# Patient Record
Sex: Male | Born: 1970
Health system: Southern US, Community
[De-identification: ages and names within clinical notes are randomized; demographics above are authoritative.]

## PROBLEM LIST (undated history)

## (undated) DIAGNOSIS — B999 Unspecified infectious disease: Secondary | ICD-10-CM

## (undated) DIAGNOSIS — T4145XA Adverse effect of unspecified anesthetic, initial encounter: Secondary | ICD-10-CM

## (undated) DIAGNOSIS — J189 Pneumonia, unspecified organism: Secondary | ICD-10-CM

## (undated) DIAGNOSIS — Z8719 Personal history of other diseases of the digestive system: Secondary | ICD-10-CM

## (undated) DIAGNOSIS — B957 Other staphylococcus as the cause of diseases classified elsewhere: Secondary | ICD-10-CM

## (undated) DIAGNOSIS — K219 Gastro-esophageal reflux disease without esophagitis: Secondary | ICD-10-CM

## (undated) DIAGNOSIS — L02414 Cutaneous abscess of left upper limb: Secondary | ICD-10-CM

## (undated) DIAGNOSIS — Z973 Presence of spectacles and contact lenses: Secondary | ICD-10-CM

## (undated) DIAGNOSIS — R55 Syncope and collapse: Secondary | ICD-10-CM

## (undated) DIAGNOSIS — T8859XA Other complications of anesthesia, initial encounter: Secondary | ICD-10-CM

## (undated) DIAGNOSIS — G4730 Sleep apnea, unspecified: Secondary | ICD-10-CM

## (undated) DIAGNOSIS — S29011A Strain of muscle and tendon of front wall of thorax, initial encounter: Secondary | ICD-10-CM

## (undated) DIAGNOSIS — Z8711 Personal history of peptic ulcer disease: Secondary | ICD-10-CM

## (undated) HISTORY — PX: WISDOM TOOTH EXTRACTION: SHX21

## (undated) HISTORY — PX: COLONOSCOPY: SHX174

## (undated) HISTORY — DX: Other staphylococcus as the cause of diseases classified elsewhere: B95.7

## (undated) HISTORY — PX: TONSILLECTOMY: SUR1361

---

## 2010-11-11 ENCOUNTER — Other Ambulatory Visit: Payer: Self-pay | Admitting: Gastroenterology

## 2010-11-11 DIAGNOSIS — K6289 Other specified diseases of anus and rectum: Secondary | ICD-10-CM

## 2010-11-18 ENCOUNTER — Ambulatory Visit
Admission: RE | Admit: 2010-11-18 | Discharge: 2010-11-18 | Disposition: A | Discharge status: Home / Self Care | Payer: BC Managed Care – PPO | Source: Ambulatory Visit | Attending: Gastroenterology | Admitting: Gastroenterology

## 2010-11-18 DIAGNOSIS — K6289 Other specified diseases of anus and rectum: Secondary | ICD-10-CM

## 2010-11-18 MED ORDER — IOHEXOL 300 MG/ML  SOLN
125.0000 mL | Freq: Once | INTRAMUSCULAR | Status: AC | PRN
  Administered 2010-11-18: 125 mL via INTRAVENOUS

## 2013-11-28 ENCOUNTER — Encounter (INDEPENDENT_AMBULATORY_CARE_PROVIDER_SITE_OTHER): Payer: Self-pay | Admitting: General Surgery

## 2013-11-28 ENCOUNTER — Ambulatory Visit (INDEPENDENT_AMBULATORY_CARE_PROVIDER_SITE_OTHER): Payer: BC Managed Care – PPO | Admitting: General Surgery

## 2013-11-28 VITALS — BP 146/84 | HR 74 | Temp 97.7°F | Ht 73.0 in | Wt 200.0 lb

## 2013-11-28 DIAGNOSIS — K429 Umbilical hernia without obstruction or gangrene: Secondary | ICD-10-CM

## 2013-11-28 NOTE — Patient Instructions (Signed)
Umbilical Herniorrhaphy Herniorrhaphy is surgery to repair a hernia. A hernia is the protrusion of a part of an organ through an abdominal opening. An umbilical hernia means that your hernia is in the area around your navel. If the hernia is not repaired, the gap could get bigger. Your intestines or other tissues, such as fat, could get trapped in the gap. This can lead to other health problems, such as blocked intestines. If the hernia is fixed before problems set in, you may be allowed to go home the same day as the surgery (outpatient). LET YOUR CAREGIVER KNOW ABOUT:  Allergies to food or medicine.  Medicines taken, including vitamins, herbs, eyedrops, over-the-counter medicines, and creams.  Use of steroids (by mouth or creams).  Previous problems with anesthetics or numbing medicines.  History of bleeding problems or blood clots.  Previous surgery.  Other health problems, including diabetes and kidney problems.  Possibility of pregnancy, if this applies. RISKS AND COMPLICATIONS  Pain.  Excessive bleeding.  Hematoma. This is a pocket of blood that collects under the surgery site.  Infection at the surgery site.  Numbness at the surgery site.  Swelling and bruising.  Blood clots.  Intestinal damage (rare).  Scarring.  Skin damage.  Development of another hernia. This may require another surgery. BEFORE THE PROCEDURE  Ask your caregiver about changing or stopping your regular medicines. You may need to stop taking aspirin, nonsteroidal anti-inflammatory drugs (NSAIDs), vitamin E, and blood thinners as early as 2 weeks before the procedure.  Do not eat or drink for 8 hours before the procedure, or as directed by your caregiver.  You might be asked to shower or wash with an antibacterial soap before the procedure.  Wear comfortable clothes that will be easy to put on after the procedure. PROCEDURE You will be given an intravenous (IV) tube. A needle will be  inserted in your arm. Medicine will flow directly into your body through this needle. You might be given medicine to help you relax (sedative). You will be given medicine that numbs the area (local anesthetic) or medicine that makes you sleep (general anesthetic). If you have open surgery:  The surgeon will make a cut (incision) in your abdomen.  The gap in the muscle wall will be repaired. The surgeon may sew the edges together over the gap or use a mesh material to strengthen the area. When mesh is used, the body grows new, strong tissue into and around it. This new tissue closes the gap.  A drain might be put in to remove excess fluid from the body after surgery.  The surgeon will close the incision with stitches, glue, or staples. If you have laparoscopic surgery:  The surgeon will make several small incisions in your abdomen.  A thin, lighted tube (laparoscope) will be inserted into the abdomen through an incision. A camera is attached to the laparoscope that allows the surgeon to see inside the abdomen.  Tools will be inserted through the other incisions to repair the hernia. Usually, mesh is used to cover the gap.  The surgeon will close the incisions with stitches. AFTER THE PROCEDURE  You will be taken to a recovery area. A nurse will watch and check your progress.  When you are awake, feeling well, and taking fluids well, you may be allowed to go home. In some cases, you may need to stay overnight in the hospital.  Arrange for someone to drive you home. Document Released: 09/08/2008 Document Revised: 12/12/2011 Document   Reviewed: 09/13/2011 Exit[employer] Patient Information 2014 [employer].  

## 2013-11-28 NOTE — Progress Notes (Signed)
Subjective:   hernia at umbilicus  Patient ID: Jerry Carey, male   DOB: Jun 27, 1970, 43 y.o.   MRN: [id number]  HPI Patient is a 43 year old male referred by Dr. Juleen China for an umbilical hernia. The patient first noted a bulge at his umbilicus several months ago. He has not had any pain or associated GI symptoms. 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 He is very concerned about cosmesis of the hernia and the repair in terms of his high-level compititions. He has not had any previous hernia repairs or abdominal surgery.  History reviewed. No pertinent past medical history. Past Surgical History  Procedure Laterality Date  . Wisdom tooth extraction     No current outpatient prescriptions on file.   No current facility-administered medications for this visit.   No Known Allergies History  Substance Use Topics  . Smoking status: Former Smoker    Types: Cigarettes    Quit date: 11/28/2008  . Smokeless tobacco: Not on file  . Alcohol Use: Yes     Review of Systems Comprehensive review of systems was negative    Objective:   Physical Exam General: Alert, muscular Caucasian male, in no distress Skin: Warm and dry without rash or infection. HEENT: Sclera nonicteric. Pupils equal round and reactive.  Lungs: Breath sounds clear and equal without increased work of breathing Cardiovascular: Regular rate and rhythm without murmur. No JVD or edema. Peripheral pulses intact. Abdomen: Nondistended. Soft and nontender. No masses palpable. No organomegaly. There is a discrete hernia at the umbilicus which feels to be coming through a fairly small defect. Tender on palpationNo other abdominal wall or inguinal hernias. No diastases. Extremities: No edema or joint swelling or deformity. No chronic venous stasis changes. Neurologic: Alert and fully oriented. Gait normal.    Assessment:     Umbilical hernia. He has a significant hernia  and I think is at some risk for incarceration particularly with weightlifting. I recommended repair under general anesthesia as an outpatient using a ventral patch to minimize scarring. We had a long discussion regarding his desire to return to weightlifting as soon as possible and I told him that I did not want him putting excessive strain on his abdominal musculature for 4 weeks after surgery. We discussed the indications for the surgery and nature of the surgery and recovery as well as risks of anesthetic complications, bleeding, infection and risk of recurrence. We discussed the indications for use of mesh and the type of mesh used. He was given a length for the [facility] Video for umbilical hernia repair. All his questions were answered.     Plan:     Repair of umbilical hernia with mesh under general anesthesia as an outpatient

## 2015-01-24 ENCOUNTER — Encounter (HOSPITAL_COMMUNITY): Payer: Self-pay

## 2015-01-24 ENCOUNTER — Emergency Department (HOSPITAL_COMMUNITY): Payer: Self-pay

## 2015-01-24 ENCOUNTER — Emergency Department (HOSPITAL_COMMUNITY)
Admission: EM | Admit: 2015-01-24 | Discharge: 2015-01-25 | Disposition: A | Discharge status: Home / Self Care | Payer: Self-pay | Attending: Emergency Medicine | Admitting: Emergency Medicine

## 2015-01-24 DIAGNOSIS — W231XXA Caught, crushed, jammed, or pinched between stationary objects, initial encounter: Secondary | ICD-10-CM | POA: Insufficient documentation

## 2015-01-24 DIAGNOSIS — S62308A Unspecified fracture of other metacarpal bone, initial encounter for closed fracture: Secondary | ICD-10-CM

## 2015-01-24 DIAGNOSIS — Y9389 Activity, other specified: Secondary | ICD-10-CM | POA: Insufficient documentation

## 2015-01-24 DIAGNOSIS — Z87891 Personal history of nicotine dependence: Secondary | ICD-10-CM | POA: Insufficient documentation

## 2015-01-24 DIAGNOSIS — Y929 Unspecified place or not applicable: Secondary | ICD-10-CM | POA: Insufficient documentation

## 2015-01-24 DIAGNOSIS — Y998 Other external cause status: Secondary | ICD-10-CM | POA: Insufficient documentation

## 2015-01-24 DIAGNOSIS — S62394A Other fracture of fourth metacarpal bone, right hand, initial encounter for closed fracture: Secondary | ICD-10-CM | POA: Insufficient documentation

## 2015-01-24 MED ORDER — HYDROCODONE-ACETAMINOPHEN 5-325 MG PO TABS
1.0000 | ORAL_TABLET | Freq: Once | ORAL | Status: AC
  Administered 2015-01-25: 1 via ORAL
  Filled 2015-01-24: qty 1

## 2015-01-24 NOTE — ED Provider Notes (Signed)
CSN: [id number]     Arrival date & time 01/24/15  2252 History  This chart was scribed for Everlene Farrier, PA-C, working with Mirian Mo, MD by Chestine Spore, ED Scribe. The patient was seen in room TR05C/TR05C at 11:53 PM.     Chief Complaint  Patient presents with  . Hand Injury     The history is provided by the patient. No language interpreter was used.    HPI Comments: Jerry Carey is a 44 y.o. male who presents to the Emergency Department complaining of right hand injury onset yesterday at 5:30 PM. Pt notes that he was using a gas powered agar and the machine spun and caught his right hand. Pt reports that his hand swelling has not gotten better since the incident occurred. Pt notes that he is right hand dominant. Pt reports that his pain is 5/10 while sitting and 10/10 with movement. Pt notes that he had a boxers fracture to the same hand 20 years ago He states that he is having associated symptoms of right hand swelling. He states that he has tried 800 mg of advil at 8 AM and 4 PM today with some relief for his symptoms. He denies numbness, tingling, weakness, color change, wound, and any other symptoms. He denies right wrist pain.   History reviewed. No pertinent past medical history. Past Surgical History  Procedure Laterality Date  . Wisdom tooth extraction     No family history on file. History  Substance Use Topics  . Smoking status: Former Smoker    Types: Cigarettes    Quit date: 11/28/2008  . Smokeless tobacco: Not on file  . Alcohol Use: Yes    Review of Systems  Constitutional: Negative for fever and chills.  Musculoskeletal: Positive for joint swelling and arthralgias.  Skin: Negative for color change, rash and wound.  Neurological: Negative for weakness and numbness.       No tingling      Allergies  Review of patient's allergies indicates no known allergies.  Home Medications   Prior to Admission medications   Medication Sig Start Date End  Date Taking? Authorizing Provider  HYDROcodone-acetaminophen (NORCO/VICODIN) 5-325 MG per tablet Take 1 tablet by mouth every 4 (four) hours as needed. 01/25/15   Everlene Farrier, PA-C  naproxen (NAPROSYN) 250 MG tablet Take 1 tablet (250 mg total) by mouth 2 (two) times daily with a meal. 01/25/15   Everlene Farrier, PA-C   BP 134/64 mmHg  Pulse 92  Temp(Src) 98.4 F (36.9 C) (Oral)  Resp 16  Ht  (1.854 m)  Wt 208 lb 4 oz (94.462 kg)  BMI 27.48 kg/m2  SpO2 98% Physical Exam  Constitutional: He appears well-developed and well-nourished. No distress.  HENT:  Head: Normocephalic and atraumatic.  Eyes: Right eye exhibits no discharge. Left eye exhibits no discharge.  Cardiovascular:  Bilateral radial pulses intact  Pulmonary/Chest: Effort normal. No respiratory distress.  Musculoskeletal: He exhibits edema.       Right hand: He exhibits swelling. He exhibits normal capillary refill. Normal sensation noted. Normal strength noted.  Right hand: Edema over dorsal aspect of hand. Brisk cap refill. Sensation intact. Able to make a fist. Nl knuckle alignment. Compartments are soft.  Neurological: He is alert. Coordination normal.  Skin: No rash noted. He is not diaphoretic.  Psychiatric: He has a normal mood and affect. His behavior is normal.  Nursing note and vitals reviewed.   ED Course  Procedures (including critical care time) DIAGNOSTIC STUDIES:  Oxygen Saturation is 98% on RA, nl by my interpretation.    COORDINATION OF CARE: 11:57 PM-Discussed treatment plan which includes referral to hand surgeon with pt at bedside and pt agreed to plan.   Labs Review Labs Reviewed - No data to display  Imaging Review Dg Wrist Complete Right  01/24/2015   CLINICAL DATA:  Right hand and wrist pain after injury 2 days prior. Swelling.  EXAM: RIGHT WRIST - COMPLETE 3+ VIEW  COMPARISON:  None.  FINDINGS: Fracture of the fourth metacarpal. No addition fracture or dislocation of the wrist. The wrist  alignment and joint spaces are maintained. The scaphoid is intact with cystic changes distally.  IMPRESSION: Fourth metacarpal fracture. No additional fracture or dislocation of the wrist.   Electronically Signed   By: Rubye Oaks M.D.   On: 01/24/2015 23:45   Dg Hand Complete Right  01/24/2015   CLINICAL DATA:  Right hand and wrist pain after injury 2 days prior. Swelling.  EXAM: RIGHT HAND - COMPLETE 3+ VIEW  COMPARISON:  None.  FINDINGS: Oblique mildly displaced fourth metacarpal fracture. Proximal extension may be to the carpometacarpal joint. No additional fracture of the hand. There is dorsal soft tissue edema. Cystic change noted in the distal scaphoid.  IMPRESSION: Oblique fourth metacarpal fracture, with possible extension to the carpometacarpal joint.   Electronically Signed   By: Rubye Oaks M.D.   On: 01/24/2015 23:47     EKG Interpretation None      Filed Vitals:   01/24/15 2258  BP: 134/64  Pulse: 92  Temp: 98.4 F (36.9 C)  TempSrc: Oral  Resp: 16  Height: 6\' 1"  (1.854 m)  Weight: 208 lb 4 oz (94.462 kg)  SpO2: 98%     MDM   Meds given in ED:  Medications  HYDROcodone-acetaminophen (NORCO/VICODIN) 5-325 MG per tablet 1 tablet (not administered)    New Prescriptions   HYDROCODONE-ACETAMINOPHEN (NORCO/VICODIN) 5-325 MG PER TABLET    Take 1 tablet by mouth every 4 (four) hours as needed.   NAPROXEN (NAPROSYN) 250 MG TABLET    Take 1 tablet (250 mg total) by mouth 2 (two) times daily with a meal.    Final diagnoses:  Closed fracture of 4th metacarpal, initial encounter   This is a 44 year old male who is right-hand dominant and otherwise healthy who presents to the emergency department complaining of right hand pain after he was hit with the handle of an auger 2 days ago. Patient denies any numbness or tingling or weakness. Patient reports swelling. On exam the patient is afebrile nontoxic appearing. Patient has a moderate amount of edema to the dorsal aspect  of his right hand. His hand compartment soft. He is able to wiggle his fingers without difficulty. He is able to make a fist with normal knuckle alignment. X-ray indicates an oblique fourth metacarpal fracture, with possible extension into the carpometacarpal joint. After discussing with my attending Dr. Littie Deeds, plan is for the patient to receive a splint and to follow-up with hand surgeon Dr. Mina Marble. Advised patient to call hand surgeon Dr. Mina Marble make an appointment for follow-up for tomorrow. We'll discharge the patient with Norco and naproxen. I advised the patient to follow-up with their primary care provider and hand surgery this week. I advised the patient to return to the emergency department with new or worsening symptoms or new concerns. The patient verbalized understanding and agreement with plan.    This patient was discussed with Dr. Littie Deeds who agrees with assessment  and plan.  I personally performed the services described in this documentation, which was scribed in my presence. The recorded information has been reviewed and is accurate.      Everlene Farrier, PA-C 01/25/15 1610  Mirian Mo, MD 01/28/15 [phone]

## 2015-01-24 NOTE — ED Notes (Signed)
Pt reports he was using a gas powered auger, the machine spun and hit his right hand; two days ago. Pt right hand appears swollen, +radial pulse, able to wiggle fingers. He reports his hand just hasn't gotten any better since 2 days ago when the accident happened.

## 2015-01-25 MED ORDER — HYDROCODONE-ACETAMINOPHEN 5-325 MG PO TABS: 1.0000 | ORAL_TABLET | ORAL | Status: DC | PRN

## 2015-01-25 MED ORDER — NAPROXEN 250 MG PO TABS: 250.0000 mg | ORAL_TABLET | Freq: Two times a day (BID) | ORAL | Status: DC

## 2015-01-25 NOTE — Discharge Instructions (Signed)
Hand Fracture, Metacarpals °Fractures of metacarpals are breaks in the bones of the hand. They extend from the knuckles to the wrist. These bones can undergo many types of fractures. There are different ways of treating these fractures, all of which may be correct. °TREATMENT  °Hand fractures can be treated with:  °· Non-reduction - The fracture is casted without changing the positions of the fracture (bone pieces) involved. This fracture is usually left in a cast for 4 to 6 weeks or as your caregiver thinks necessary. °· Closed reduction - The bones are moved back into position without surgery and then casted. °· ORIF (open reduction and internal fixation) - The fracture site is opened and the bone pieces are fixed into place with some type of hardware, such as screws, etc. They are then casted. °Your caregiver will discuss the type of fracture you have and the treatment that should be best for that problem. If surgery is chosen, let your caregivers know about the following.  °LET YOUR CAREGIVERS KNOW ABOUT: °· Allergies. °· Medications you are taking, including herbs, eye drops, over the counter medications, and creams. °· Use of steroids (by mouth or creams). °· Previous problems with anesthetics or novocaine. °· Possibility of pregnancy. °· History of blood clots (thrombophlebitis). °· History of bleeding or blood problems. °· Previous surgeries. °· Other health problems. °AFTER THE PROCEDURE °After surgery, you will be taken to the recovery area where a nurse will watch and check your progress. Once you are awake, stable, and taking fluids well, barring other problems, you'll be allowed to go home. Once home, an ice pack applied to your operative site may help with pain and keep the swelling down. °HOME CARE INSTRUCTIONS  °· Follow your caregiver's instructions as to activities, exercises, physical therapy, and driving a car. °· Daily exercise is helpful for keeping range of motion and strength. Exercise as  instructed. °· To lessen swelling, keep the injured hand elevated above the level of your heart as much as possible. °· Apply ice to the injury for 15-20 minutes each hour while awake for the first 2 days. Put the ice in a plastic bag and place a thin towel between the bag of ice and your cast. °· Move the fingers of your casted hand several times a day. °· If a plaster or fiberglass cast was applied: °· Do not try to scratch the skin under the cast using a sharp or pointed object. °· Check the skin around the cast every day. You may put lotion on red or sore areas. °· Keep your cast dry. Your cast can be protected during bathing with a plastic bag. Do not put your cast into the water. °· If a plaster splint was applied: °· Wear your splint for as long as directed by your caregiver or until seen again. °· Do not get your splint wet. Protect it during bathing with a plastic bag. °· You may loosen the elastic bandage around the splint if your fingers start to get numb, tingle, get cold or turn blue. °· Do not put pressure on your cast or splint; this may cause it to break. Especially, do not lean plaster casts on hard surfaces for 24 hours after application. °· Take medications as directed by your caregiver. °· Only take over-the-counter or prescription medicines for pain, discomfort, or fever as directed by your caregiver. °· Follow-up as provided by your caregiver. This is very important in order to avoid permanent injury or disability and chronic   pain. °SEEK MEDICAL CARE IF:  °· Increased bleeding (more than a small spot) from beneath your cast or splint if there is beneath the cast as with an open reduction. °· Redness, swelling, or increasing pain in the wound or from beneath your cast or splint. °· Pus coming from wound or from beneath your cast or splint. °· An unexplained oral temperature above 102° F (38.9° C) develops, or as your caregiver suggests. °· A foul smell coming from the wound or dressing or from  beneath your cast or splint. °· You have a problem moving any of your fingers. °SEEK IMMEDIATE MEDICAL CARE IF:  °· You develop a rash °· You have difficulty breathing °· You have any allergy problems °If you do not have a window in your cast for observing the wound, a discharge or minor bleeding may show up as a stain on the outside of your cast. Report these findings to your caregiver. °MAKE SURE YOU:  °· Understand these instructions. °· Will watch your condition. °· Will get help right away if you are not doing well or get worse. °Document Released: 06/12/2005 Document Revised: 09/04/2011 Document Reviewed: 01/30/2008 °[employer]® Patient Information ©2015 [employer]. This information is not intended to replace advice given to you by your health care provider. Make sure you discuss any questions you have with your health care provider. ° °Cast or Splint Care °Casts and splints support injured limbs and keep bones from moving while they heal. It is important to care for your cast or splint at home.   °HOME CARE INSTRUCTIONS °· Keep the cast or splint uncovered during the drying period. It can take 24 to 48 hours to dry if it is made of plaster. A fiberglass cast will dry in less than 1 hour. °· Do not rest the cast on anything harder than a pillow for the first 24 hours. °· Do not put weight on your injured limb or apply pressure to the cast until your health care provider gives you permission. °· Keep the cast or splint dry. Wet casts or splints can lose their shape and may not support the limb as well. A wet cast that has lost its shape can also create harmful pressure on your skin when it dries. Also, wet skin can become infected. °¨ Cover the cast or splint with a plastic bag when bathing or when out in the rain or snow. If the cast is on the trunk of the body, take sponge baths until the cast is removed. °¨ If your cast does become wet, dry it with a towel or a blow dryer on the cool setting only. °· Keep  your cast or splint clean. Soiled casts may be wiped with a moistened cloth. °· Do not place any hard or soft foreign objects under your cast or splint, such as cotton, toilet paper, lotion, or powder. °· Do not try to scratch the skin under the cast with any object. The object could get stuck inside the cast. Also, scratching could lead to an infection. If itching is a problem, use a blow dryer on a cool setting to relieve discomfort. °· Do not trim or cut your cast or remove padding from inside of it. °· Exercise all joints next to the injury that are not immobilized by the cast or splint. For example, if you have a long leg cast, exercise the hip joint and toes. If you have an arm cast or splint, exercise the shoulder, elbow, thumb, and fingers. °·   Elevate your injured arm or leg on 1 or 2 pillows for the first 1 to 3 days to decrease swelling and pain. It is best if you can comfortably elevate your cast so it is higher than your heart. °SEEK MEDICAL CARE IF:  °· Your cast or splint cracks. °· Your cast or splint is too tight or too loose. °· You have unbearable itching inside the cast. °· Your cast becomes wet or develops a soft spot or area. °· You have a bad smell coming from inside your cast. °· You get an object stuck under your cast. °· Your skin around the cast becomes red or raw. °· You have new pain or worsening pain after the cast has been applied. °SEEK IMMEDIATE MEDICAL CARE IF:  °· You have fluid leaking through the cast. °· You are unable to move your fingers or toes. °· You have discolored (blue or white), cool, painful, or very swollen fingers or toes beyond the cast. °· You have tingling or numbness around the injured area. °· You have severe pain or pressure under the cast. °· You have any difficulty with your breathing or have shortness of breath. °· You have chest pain. °Document Released: 06/09/2000 Document Revised: 04/02/2013 Document Reviewed: 12/19/2012 °[employer]® Patient Information  ©2015 [employer]. This information is not intended to replace advice given to you by your health care provider. Make sure you discuss any questions you have with your health care provider. ° °

## 2015-01-25 NOTE — Progress Notes (Addendum)
Orthopedic Tech Progress Note Patient Details:  Jerry Carey [birth year] [id number] Dorsal and ventral volar splint applied Ortho Devices Type of Ortho Device: Arm sling, Volar splint Ortho Device/Splint Location: RUe Ortho Device/Splint Interventions: Application   [name] 01/25/2015, 12:21 AM

## 2015-05-24 ENCOUNTER — Encounter (HOSPITAL_COMMUNITY): Payer: Self-pay | Admitting: *Deleted

## 2015-05-24 ENCOUNTER — Emergency Department (HOSPITAL_COMMUNITY)
Admission: EM | Admit: 2015-05-24 | Discharge: 2015-05-25 | Disposition: A | Discharge status: Home / Self Care | Payer: Self-pay | Attending: Emergency Medicine | Admitting: Emergency Medicine

## 2015-05-24 DIAGNOSIS — S46912A Strain of unspecified muscle, fascia and tendon at shoulder and upper arm level, left arm, initial encounter: Secondary | ICD-10-CM | POA: Insufficient documentation

## 2015-05-24 DIAGNOSIS — Z87891 Personal history of nicotine dependence: Secondary | ICD-10-CM | POA: Insufficient documentation

## 2015-05-24 DIAGNOSIS — Y9389 Activity, other specified: Secondary | ICD-10-CM | POA: Insufficient documentation

## 2015-05-24 DIAGNOSIS — Y9289 Other specified places as the place of occurrence of the external cause: Secondary | ICD-10-CM | POA: Insufficient documentation

## 2015-05-24 DIAGNOSIS — X509XXA Other and unspecified overexertion or strenuous movements or postures, initial encounter: Secondary | ICD-10-CM | POA: Insufficient documentation

## 2015-05-24 DIAGNOSIS — Z791 Long term (current) use of non-steroidal anti-inflammatories (NSAID): Secondary | ICD-10-CM | POA: Insufficient documentation

## 2015-05-24 DIAGNOSIS — Y998 Other external cause status: Secondary | ICD-10-CM | POA: Insufficient documentation

## 2015-05-24 MED ORDER — FENTANYL CITRATE (PF) 100 MCG/2ML IJ SOLN
50.0000 ug | Freq: Once | INTRAMUSCULAR | Status: AC
  Administered 2015-05-24: 50 ug via NASAL
  Filled 2015-05-24: qty 2

## 2015-05-24 MED ORDER — FENTANYL CITRATE (PF) 100 MCG/2ML IJ SOLN
50.0000 ug | Freq: Once | INTRAMUSCULAR | Status: AC
  Administered 2015-05-24: 50 ug via NASAL

## 2015-05-24 MED ORDER — FENTANYL CITRATE (PF) 100 MCG/2ML IJ SOLN
INTRAMUSCULAR | Status: AC
  Filled 2015-05-24: qty 2

## 2015-05-24 NOTE — ED Notes (Signed)
Pt reports working out, believes he tore his left pectoral muscle and having severe pain to left chest/shoulder area. Appears in severe pain at triage.

## 2015-05-25 ENCOUNTER — Emergency Department (HOSPITAL_COMMUNITY): Payer: Self-pay

## 2015-05-25 MED ORDER — CYCLOBENZAPRINE HCL 10 MG PO TABS
10.0000 mg | ORAL_TABLET | Freq: Once | ORAL | Status: AC
  Administered 2015-05-25: 10 mg via ORAL
  Filled 2015-05-25: qty 1

## 2015-05-25 MED ORDER — OXYCODONE-ACETAMINOPHEN 5-325 MG PO TABS: 1.0000 | ORAL_TABLET | ORAL | Status: DC | PRN

## 2015-05-25 MED ORDER — ORPHENADRINE CITRATE ER 100 MG PO TB12: 100.0000 mg | ORAL_TABLET | Freq: Two times a day (BID) | ORAL | Status: DC

## 2015-05-25 MED ORDER — OXYCODONE-ACETAMINOPHEN 5-325 MG PO TABS
1.0000 | ORAL_TABLET | Freq: Once | ORAL | Status: AC
  Administered 2015-05-25: 1 via ORAL
  Filled 2015-05-25: qty 1

## 2015-05-25 MED ORDER — FENTANYL CITRATE (PF) 100 MCG/2ML IJ SOLN
50.0000 ug | Freq: Once | INTRAMUSCULAR | Status: AC
  Administered 2015-05-25: 50 ug via NASAL
  Filled 2015-05-25: qty 2

## 2015-05-25 MED ORDER — IBUPROFEN 800 MG PO TABS
800.0000 mg | ORAL_TABLET | Freq: Once | ORAL | Status: AC
  Administered 2015-05-25: 800 mg via ORAL
  Filled 2015-05-25: qty 1

## 2015-05-25 MED ORDER — NAPROXEN 500 MG PO TABS: 500.0000 mg | ORAL_TABLET | Freq: Two times a day (BID) | ORAL | Status: DC

## 2015-05-25 NOTE — ED Provider Notes (Signed)
CSN: [id number]     Arrival date & time 05/24/15  1833 History  By signing my name below, I, Emmanuella Mensah, attest that this documentation has been prepared under the direction and in the presence of Dione Booze, MD. Electronically Signed: Angelene Giovanni, ED Scribe. 05/25/2015. 12:23 AM.    Chief Complaint  Patient presents with  . Pain   The history is provided by the patient. No language interpreter was used.   HPI Comments: Jerry Carey is a 44 y.o. male who presents to the Emergency Department complaining of gradually worsening severe left shoulder pain s/p weight injury that occurred at the gym earlier today. 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 He reports that he is here to see if he has a partial or complete tear. He denies currently having an Orthopedics.    History reviewed. No pertinent past medical history. Past Surgical History  Procedure Laterality Date  . Wisdom tooth extraction     History reviewed. No pertinent family history. Social History  Substance Use Topics  . Smoking status: Former Smoker    Types: Cigarettes    Quit date: 11/28/2008  . Smokeless tobacco: None  . Alcohol Use: Yes    Review of Systems  Constitutional: Negative for fever and chills.  Musculoskeletal: Positive for myalgias and arthralgias.  Neurological: Negative for numbness.  All other systems reviewed and are negative.     Allergies  Review of patient's allergies indicates no known allergies.  Home Medications   Prior to Admission medications   Medication Sig Start Date End Date Taking? Authorizing Provider  HYDROcodone-acetaminophen (NORCO/VICODIN) 5-325 MG per tablet Take 1 tablet by mouth every 4 (four) hours as needed. 01/25/15   Everlene Farrier, PA-C  naproxen (NAPROSYN) 250 MG tablet Take 1 tablet  (250 mg total) by mouth 2 (two) times daily with a meal. 01/25/15   Everlene Farrier, PA-C   BP 157/90 mmHg  Pulse 76  Temp(Src) 98.5 F (36.9 C) (Oral)  Resp 16  SpO2 96% Physical Exam  Constitutional: He is oriented to person, place, and time. He appears well-developed and well-nourished. No distress.  HENT:  Head: Normocephalic and atraumatic.  Eyes: EOM are normal. Pupils are equal, round, and reactive to light.  Neck: Normal range of motion. Neck supple. No JVD present.  Cardiovascular: Normal rate, regular rhythm and normal heart sounds.   No murmur heard. Pulmonary/Chest: Effort normal and breath sounds normal. No respiratory distress. He has no wheezes. He has no rales. He exhibits no tenderness.  Abdominal: Soft. Bowel sounds are normal. He exhibits no distension and no mass. There is no tenderness.  Musculoskeletal: Normal range of motion. He exhibits tenderness. He exhibits no edema.  Tenderness over the left shoulder anteriorly  Pain on passive external rotation, neurovascularly intact.   Lymphadenopathy:    He has no cervical adenopathy.  Neurological: He is alert and oriented to person, place, and time. No cranial nerve deficit. He exhibits normal muscle tone. Coordination normal.  Skin: Skin is warm and dry. No rash noted.  Psychiatric: He has a normal mood and affect. His behavior is normal. Judgment and thought content normal.  Nursing note and vitals reviewed.   ED Course  Procedures (including critical care time) DIAGNOSTIC STUDIES: Oxygen Saturation is 96% on RA, adequate by my interpretation.    COORDINATION OF  CARE: 12:20 AM- Pt advised of plan for treatment and pt agrees. Pt will receive X-ray and pain medication.   Imaging Review Dg Shoulder Left  05/25/2015  CLINICAL DATA:  Left chest pain after weight lifting tonight. EXAM: LEFT SHOULDER - 2+ VIEW COMPARISON:  None. FINDINGS: There is no evidence of fracture or dislocation. There is no evidence of  arthropathy or other focal bone abnormality. Soft tissues are unremarkable. IMPRESSION: Negative. Electronically Signed   By: Ellery Plunkaniel R Mitchell M.D.   On: 05/25/2015 01:37     Dione Boozeavid Ijanae Macapagal, MD has personally reviewed and evaluated these images as part of his medical decision-making.   MDM   Final diagnoses:  Strain of left shoulder, initial encounter    Strain of the left shoulder. He appears to partly or possibly completely torn his paralysis major muscle or tendon. He is given oxycodone have acetaminophen for pain and sent for x-rays which showed no bony injury. Old records are reviewed and he has no relevant past records. He had good relief of pain with above noted treatment. He is given a sling for comfort and is referred to orthopedics for follow-up. Prescriptions are given for naproxen, orphenadrine, and oxycodone-acetaminophen.  I personally performed the services described in this documentation, which was scribed in my presence. The recorded information has been reviewed and is accurate.       Dione Boozeavid Nanna Ertle, MD 05/25/15 [phone]

## 2015-05-25 NOTE — Discharge Instructions (Signed)
Wear sling as needed.   Muscle Strain A muscle strain is an injury that occurs when a muscle is stretched beyond its normal length. Usually a small number of muscle fibers are torn when this happens. Muscle strain is rated in degrees. First-degree strains have the least amount of muscle fiber tearing and pain. Second-degree and third-degree strains have increasingly more tearing and pain.  Usually, recovery from muscle strain takes 1-2 weeks. Complete healing takes 5-6 weeks.  CAUSES  Muscle strain happens when a sudden, violent force placed on a muscle stretches it too far. This may occur with lifting, sports, or a fall.  RISK FACTORS Muscle strain is especially common in athletes.  SIGNS AND SYMPTOMS At the site of the muscle strain, there may be:  Pain.  Bruising.  Swelling.  Difficulty using the muscle due to pain or lack of normal function. DIAGNOSIS  Your health care provider will perform a physical exam and ask about your medical history. TREATMENT  Often, the best treatment for a muscle strain is resting, icing, and applying cold compresses to the injured area.  HOME CARE INSTRUCTIONS   Use the PRICE method of treatment to promote muscle healing during the first 2-3 days after your injury. The PRICE method involves:  Protecting the muscle from being injured again.  Restricting your activity and resting the injured body part.  Icing your injury. To do this, put ice in a plastic bag. Place a towel between your skin and the bag. Then, apply the ice and leave it on from 15-20 minutes each hour. After the third day, switch to moist heat packs.  Apply compression to the injured area with a splint or elastic bandage. Be careful not to wrap it too tightly. This may interfere with blood circulation or increase swelling.  Elevate the injured body part above the level of your heart as often as you can.  Only take over-the-counter or prescription medicines for pain, discomfort, or  fever as directed by your health care provider.  Warming up prior to exercise helps to prevent future muscle strains. SEEK MEDICAL CARE IF:   You have increasing pain or swelling in the injured area.  You have numbness, tingling, or a significant loss of strength in the injured area. MAKE SURE YOU:   Understand these instructions.  Will watch your condition.  Will get help right away if you are not doing well or get worse.   This information is not intended to replace advice given to you by your health care provider. Make sure you discuss any questions you have with your health care provider.   Document Released: 06/12/2005 Document Revised: 04/02/2013 Document Reviewed: 01/09/2013 Elsevier Interactive Patient Education 2016 [employer]  Naproxen and naproxen sodium oral immediate-release tablets What is this medicine? NAPROXEN (na PROX en) is a non-steroidal anti-inflammatory drug (NSAID). It is used to reduce swelling and to treat pain. This medicine may be used for dental pain, headache, or painful monthly periods. It is also used for painful joint and muscular problems such as arthritis, tendinitis, bursitis, and gout. This medicine may be used for other purposes; ask your health care provider or pharmacist if you have questions. What should I tell my health care provider before I take this medicine? They need to know if you have any of these conditions: -asthma -cigarette smoker -drink more than 3 alcohol containing drinks a day -heart disease or circulation problems such as heart failure or leg edema (fluid retention) -high blood pressure -kidney  disease -liver disease -stomach bleeding or ulcers -an unusual or allergic reaction to naproxen, aspirin, other NSAIDs, other medicines, foods, dyes, or preservatives -pregnant or trying to get pregnant -breast-feeding How should I use this medicine? Take this medicine by mouth with a glass of water. Follow the directions on  the prescription label. Take it with food if your stomach gets upset. Try to not lie down for at least 10 minutes after you take it. Take your medicine at regular intervals. Do not take your medicine more often than directed. Long-term, continuous use may increase the risk of heart attack or stroke. A special MedGuide will be given to you by the pharmacist with each prescription and refill. Be sure to read this information carefully each time. Talk to your pediatrician regarding the use of this medicine in children. Special care may be needed. Overdosage: If you think you have taken too much of this medicine contact a poison control center or emergency room at once. NOTE: This medicine is only for you. Do not share this medicine with others. What if I miss a dose? If you miss a dose, take it as soon as you can. If it is almost time for your next dose, take only that dose. Do not take double or extra doses. What may interact with this medicine? -alcohol -aspirin -cidofovir -diuretics -lithium -methotrexate -other drugs for inflammation like ketorolac or prednisone -pemetrexed -probenecid -warfarin This list may not describe all possible interactions. Give your health care provider a list of all the medicines, herbs, non-prescription drugs, or dietary supplements you use. Also tell them if you smoke, drink alcohol, or use illegal drugs. Some items may interact with your medicine. What should I watch for while using this medicine? Tell your doctor or health care professional if your pain does not get better. Talk to your doctor before taking another medicine for pain. Do not treat yourself. This medicine does not prevent heart attack or stroke. In fact, this medicine may increase the chance of a heart attack or stroke. The chance may increase with longer use of this medicine and in people who have heart disease. If you take aspirin to prevent heart attack or stroke, talk with your doctor or health  care professional. Do not take other medicines that contain aspirin, ibuprofen, or naproxen with this medicine. Side effects such as stomach upset, nausea, or ulcers may be more likely to occur. Many medicines available without a prescription should not be taken with this medicine. This medicine can cause ulcers and bleeding in the stomach and intestines at any time during treatment. Do not smoke cigarettes or drink alcohol. These increase irritation to your stomach and can make it more susceptible to damage from this medicine. Ulcers and bleeding can happen without warning symptoms and can cause death. You may get drowsy or dizzy. Do not drive, use machinery, or do anything that needs mental alertness until you know how this medicine affects you. Do not stand or sit up quickly, especially if you are an older patient. This reduces the risk of dizzy or fainting spells. This medicine can cause you to bleed more easily. Try to avoid damage to your teeth and gums when you brush or floss your teeth. What side effects may I notice from receiving this medicine? Side effects that you should report to your doctor or health care professional as soon as possible: -black or bloody stools, blood in the urine or vomit -blurred vision -chest pain -difficulty breathing or wheezing -nausea  or vomiting -severe stomach pain -skin rash, skin redness, blistering or peeling skin, hives, or itching -slurred speech or weakness on one side of the body -swelling of eyelids, throat, lips -unexplained weight gain or swelling -unusually weak or tired -yellowing of eyes or skin Side effects that usually do not require medical attention (report to your doctor or health care professional if they continue or are bothersome): -constipation -headache -heartburn This list may not describe all possible side effects. Call your doctor for medical advice about side effects. You may report side effects to FDA at  1-800-FDA-1088. Where should I keep my medicine? Keep out of the reach of children. Store at room temperature between 15 and 30 degrees C (59 and 86 degrees F). Keep container tightly closed. Throw away any unused medicine after the expiration date. NOTE: This sheet is a summary. It may not cover all possible information. If you have questions about this medicine, talk to your doctor, pharmacist, or health care provider.    2016, Elsevier/Gold Standard. (2009-06-14 20:10:16)  Orphenadrine tablets What is this medicine? ORPHENADRINE (or FEN a dreen) helps to relieve pain and stiffness in muscles and can treat muscle spasms. This medicine may be used for other purposes; ask your health care provider or pharmacist if you have questions. What should I tell my health care provider before I take this medicine? They need to know if you have any of these conditions: -glaucoma -heart disease -kidney disease -myasthenia gravis -peptic ulcer disease -prostate disease -stomach problems -an unusual or allergic reaction to orphenadrine, other medicines, foods, lactose, dyes, or preservatives -pregnant or trying to get pregnant -breast-feeding How should I use this medicine? Take this medicine by mouth with a full glass of water. Follow the directions on the prescription label. Take your medicine at regular intervals. Do not take your medicine more often than directed. Do not take more than you are told to take. Talk to your pediatrician regarding the use of this medicine in children. Special care may be needed. Patients over 29 years old may have a stronger reaction and need a smaller dose. Overdosage: If you think you have taken too much of this medicine contact a poison control center or emergency room at once. NOTE: This medicine is only for you. Do not share this medicine with others. What if I miss a dose? If you miss a dose, take it as soon as you can. If it is almost time for your next dose,  take only that dose. Do not take double or extra doses. What may interact with this medicine? -alcohol -antihistamines -barbiturates, like phenobarbital -benzodiazepines -cyclobenzaprine -medicines for pain -phenothiazines like chlorpromazine, mesoridazine, prochlorperazine, thioridazine This list may not describe all possible interactions. Give your health care provider a list of all the medicines, herbs, non-prescription drugs, or dietary supplements you use. Also tell them if you smoke, drink alcohol, or use illegal drugs. Some items may interact with your medicine. What should I watch for while using this medicine? Your mouth may get dry. Chewing sugarless gum or sucking hard candy, and drinking plenty of water may help. Contact your doctor if the problem does not go away or is severe. This medicine may cause dry eyes and blurred vision. If you wear contact lenses you may feel some discomfort. Lubricating drops may help. See your eye doctor if the problem does not go away or is severe. You may get drowsy or dizzy. Do not drive, use machinery, or do anything that needs mental alertness  until you know how this medicine affects you. Do not stand or sit up quickly, especially if you are an older patient. This reduces the risk of dizzy or fainting spells. Alcohol may interfere with the effect of this medicine. Avoid alcoholic drinks. What side effects may I notice from receiving this medicine? Side effects that you should report to your doctor or health care professional as soon as possible: -allergic reactions like skin rash, itching or hives, swelling of the face, lips, or tongue -changes in vision -difficulty breathing -fast heartbeat or palpitations -hallucinations -light headedness, fainting spells -vomiting Side effects that usually do not require medical attention (report to your doctor or health care professional if they continue or are  bothersome): -dizziness -drowsiness -headache -nausea This list may not describe all possible side effects. Call your doctor for medical advice about side effects. You may report side effects to FDA at 1-800-FDA-1088. Where should I keep my medicine? Keep out of the reach of children. This medicine may cause accidental overdose and death if it taken by other adults, children, or pets. Mix any unused medicine with a substance like cat litter or coffee grounds. Then throw the medicine away in a sealed container like a sealed bag or a coffee can with a lid. Do not use the medicine after the expiration date. Store at room temperature between 15 and 30 degrees C (59 and 86 degrees F). NOTE: This sheet is a summary. It may not cover all possible information. If you have questions about this medicine, talk to your doctor, pharmacist, or health care provider.    2016, Elsevier/Gold Standard. (2013-08-08 15:35:08)  Acetaminophen; Oxycodone tablets What is this medicine? ACETAMINOPHEN; OXYCODONE (a set a MEE noe fen; ox i KOE done) is a pain reliever. It is used to treat moderate to severe pain. This medicine may be used for other purposes; ask your health care provider or pharmacist if you have questions. What should I tell my health care provider before I take this medicine? They need to know if you have any of these conditions: -brain tumor -Crohn's disease, inflammatory bowel disease, or ulcerative colitis -drug abuse or addiction -head injury -heart or circulation problems -if you often drink alcohol -kidney disease or problems going to the bathroom -liver disease -lung disease, asthma, or breathing problems -an unusual or allergic reaction to acetaminophen, oxycodone, other opioid analgesics, other medicines, foods, dyes, or preservatives -pregnant or trying to get pregnant -breast-feeding How should I use this medicine? Take this medicine by mouth with a full glass of water. Follow the  directions on the prescription label. You can take it with or without food. If it upsets your stomach, take it with food. Take your medicine at regular intervals. Do not take it more often than directed. Talk to your pediatrician regarding the use of this medicine in children. Special care may be needed. Patients over 17 years old may have a stronger reaction and need a smaller dose. Overdosage: If you think you have taken too much of this medicine contact a poison control center or emergency room at once. NOTE: This medicine is only for you. Do not share this medicine with others. What if I miss a dose? If you miss a dose, take it as soon as you can. If it is almost time for your next dose, take only that dose. Do not take double or extra doses. What may interact with this medicine? -alcohol -antihistamines -barbiturates like amobarbital, butalbital, butabarbital, methohexital, pentobarbital, phenobarbital, thiopental, and  secobarbital -benztropine -drugs for bladder problems like solifenacin, trospium, oxybutynin, tolterodine, hyoscyamine, and methscopolamine -drugs for breathing problems like ipratropium and tiotropium -drugs for certain stomach or intestine problems like propantheline, homatropine methylbromide, glycopyrrolate, atropine, belladonna, and dicyclomine -general anesthetics like etomidate, ketamine, nitrous oxide, propofol, desflurane, enflurane, halothane, isoflurane, and sevoflurane -medicines for depression, anxiety, or psychotic disturbances -medicines for sleep -muscle relaxants -naltrexone -narcotic medicines (opiates) for pain -phenothiazines like perphenazine, thioridazine, chlorpromazine, mesoridazine, fluphenazine, prochlorperazine, promazine, and trifluoperazine -scopolamine -tramadol -trihexyphenidyl This list may not describe all possible interactions. Give your health care provider a list of all the medicines, herbs, non-prescription drugs, or dietary  supplements you use. Also tell them if you smoke, drink alcohol, or use illegal drugs. Some items may interact with your medicine. What should I watch for while using this medicine? Tell your doctor or health care professional if your pain does not go away, if it gets worse, or if you have new or a different type of pain. You may develop tolerance to the medicine. Tolerance means that you will need a higher dose of the medication for pain relief. Tolerance is normal and is expected if you take this medicine for a long time. Do not suddenly stop taking your medicine because you may develop a severe reaction. Your body becomes used to the medicine. This does NOT mean you are addicted. Addiction is a behavior related to getting and using a drug for a non-medical reason. If you have pain, you have a medical reason to take pain medicine. Your doctor will tell you how much medicine to take. If your doctor wants you to stop the medicine, the dose will be slowly lowered over time to avoid any side effects. You may get drowsy or dizzy. Do not drive, use machinery, or do anything that needs mental alertness until you know how this medicine affects you. Do not stand or sit up quickly, especially if you are an older patient. This reduces the risk of dizzy or fainting spells. Alcohol may interfere with the effect of this medicine. Avoid alcoholic drinks. There are different types of narcotic medicines (opiates) for pain. If you take more than one type at the same time, you may have more side effects. Give your health care provider a list of all medicines you use. Your doctor will tell you how much medicine to take. Do not take more medicine than directed. Call emergency for help if you have problems breathing. The medicine will cause constipation. Try to have a bowel movement at least every 2 to 3 days. If you do not have a bowel movement for 3 days, call your doctor or health care professional. Do not take Tylenol  (acetaminophen) or medicines that have acetaminophen with this medicine. Too much acetaminophen can be very dangerous. Many nonprescription medicines contain acetaminophen. Always read the labels carefully to avoid taking more acetaminophen. What side effects may I notice from receiving this medicine? Side effects that you should report to your doctor or health care professional as soon as possible: -allergic reactions like skin rash, itching or hives, swelling of the face, lips, or tongue -breathing difficulties, wheezing -confusion -light headedness or fainting spells -severe stomach pain -unusually weak or tired -yellowing of the skin or the whites of the eyes Side effects that usually do not require medical attention (report to your doctor or health care professional if they continue or are bothersome): -dizziness -drowsiness -nausea -vomiting This list may not describe all possible side effects. Call your doctor for  medical advice about side effects. You may report side effects to FDA at 1-800-FDA-1088. Where should I keep my medicine? Keep out of the reach of children. This medicine can be abused. Keep your medicine in a safe place to protect it from theft. Do not share this medicine with anyone. Selling or giving away this medicine is dangerous and against the law. This medicine may cause accidental overdose and death if it taken by other adults, children, or pets. Mix any unused medicine with a substance like cat litter or coffee grounds. Then throw the medicine away in a sealed container like a sealed bag or a coffee can with a lid. Do not use the medicine after the expiration date. Store at room temperature between 20 and 25 degrees C (68 and 77 degrees F). NOTE: This sheet is a summary. It may not cover all possible information. If you have questions about this medicine, talk to your doctor, pharmacist, or health care provider.    2016, Elsevier/Gold Standard. (2014-05-13  15:18:46)

## 2015-05-27 ENCOUNTER — Other Ambulatory Visit (HOSPITAL_COMMUNITY): Payer: Self-pay | Admitting: Orthopedic Surgery

## 2015-05-27 DIAGNOSIS — M25512 Pain in left shoulder: Secondary | ICD-10-CM

## 2015-05-29 ENCOUNTER — Ambulatory Visit (HOSPITAL_BASED_OUTPATIENT_CLINIC_OR_DEPARTMENT_OTHER)
Admission: RE | Admit: 2015-05-29 | Discharge: 2015-05-29 | Disposition: A | Discharge status: Home / Self Care | Payer: Self-pay | Source: Ambulatory Visit | Attending: Orthopedic Surgery | Admitting: Orthopedic Surgery

## 2015-05-29 DIAGNOSIS — M25512 Pain in left shoulder: Secondary | ICD-10-CM | POA: Insufficient documentation

## 2015-05-29 DIAGNOSIS — S46812A Strain of other muscles, fascia and tendons at shoulder and upper arm level, left arm, initial encounter: Secondary | ICD-10-CM | POA: Insufficient documentation

## 2015-05-29 DIAGNOSIS — X500XXA Overexertion from strenuous movement or load, initial encounter: Secondary | ICD-10-CM | POA: Insufficient documentation

## 2015-05-31 ENCOUNTER — Encounter (HOSPITAL_BASED_OUTPATIENT_CLINIC_OR_DEPARTMENT_OTHER): Payer: Self-pay | Admitting: *Deleted

## 2015-05-31 DIAGNOSIS — S29011A Strain of muscle and tendon of front wall of thorax, initial encounter: Secondary | ICD-10-CM | POA: Diagnosis present

## 2015-05-31 DIAGNOSIS — K219 Gastro-esophageal reflux disease without esophagitis: Secondary | ICD-10-CM | POA: Diagnosis present

## 2015-05-31 NOTE — H&P (Signed)
Jerry DegreeJoseph Carey is an 44 y.o. male.   Chief Complaint: left pectoralis major tear HPI: This patient was bench pressing when he felt a pop in his left arm.  He had immediate pain swelling and deformity.  MRI confirms pectoralis major tear.  Past Medical History  Diagnosis Date  . GERD (gastroesophageal reflux disease)   . Pectoralis muscle rupture     left    Past Surgical History  Procedure Laterality Date  . Wisdom tooth extraction    . Tonsillectomy      History reviewed. No pertinent family history. Social History:  reports that he quit smoking about 6 years ago. His smoking use included Cigarettes. He does not have any smokeless tobacco history on file. He reports that he drinks alcohol. He reports that he does not use illicit drugs.  Allergies: No Known Allergies  No prescriptions prior to admission    No results found for this or any previous visit (from the past 48 hour(s)). No results found.  Review of Systems  Constitutional: Negative.   HENT: Negative.   Eyes: Negative.   Respiratory: Negative.   Cardiovascular: Negative.   Gastrointestinal: Negative.   Genitourinary: Negative.   Musculoskeletal:       Left arm and chest pain  Skin: Negative.   Neurological: Negative.   Endo/Heme/Allergies: Negative.   Psychiatric/Behavioral: Negative.     Height 6' (1.829 m), weight 94.348 kg (208 lb). Physical Exam  Constitutional: He is oriented to person, place, and time. He appears well-developed and well-nourished.  HENT:  Head: Normocephalic and atraumatic.  Mouth/Throat: Oropharynx is clear and moist.  Eyes: Pupils are equal, round, and reactive to light.  Neck: Neck supple.  Cardiovascular: Normal rate.   Respiratory: Effort normal.  GI: Soft.  Genitourinary:  Not pertinent to current symptomatology therefore not examined.  Musculoskeletal:  Left arm pain with obvious pectoralis deformity and ecchymosis.  Right arm has full range of motion without pain  swelling or deformity.  Bilaterally equal radial pulses  Neurological: He is alert and oriented to person, place, and time.  Skin: Skin is warm and dry.  Psychiatric: He has a normal mood and affect. His behavior is normal.     Assessment Active Problems:   GERD (gastroesophageal reflux disease)   Pectoralis muscle rupture   Plan Dr Thurston HoleWainer discussed a left pectoralis major repair.   The risks, benefits, and possible complications of the procedure were discussed in detail with the patient.  The patient is without question.  Nekeya Briski J 05/31/2015, 5:30 PM

## 2015-06-03 ENCOUNTER — Encounter (HOSPITAL_BASED_OUTPATIENT_CLINIC_OR_DEPARTMENT_OTHER)
Admission: RE | Disposition: A | Discharge status: Home / Self Care | Payer: Self-pay | Source: Ambulatory Visit | Attending: Orthopedic Surgery

## 2015-06-03 ENCOUNTER — Ambulatory Visit (HOSPITAL_BASED_OUTPATIENT_CLINIC_OR_DEPARTMENT_OTHER)
Admission: RE | Admit: 2015-06-03 | Discharge: 2015-06-03 | Disposition: A | Discharge status: Home / Self Care | Payer: Self-pay | Source: Ambulatory Visit | Attending: Orthopedic Surgery | Admitting: Orthopedic Surgery

## 2015-06-03 ENCOUNTER — Encounter (HOSPITAL_BASED_OUTPATIENT_CLINIC_OR_DEPARTMENT_OTHER): Payer: Self-pay

## 2015-06-03 ENCOUNTER — Ambulatory Visit (HOSPITAL_BASED_OUTPATIENT_CLINIC_OR_DEPARTMENT_OTHER): Payer: Self-pay | Admitting: Anesthesiology

## 2015-06-03 DIAGNOSIS — M6218 Other rupture of muscle (nontraumatic), other site: Secondary | ICD-10-CM | POA: Insufficient documentation

## 2015-06-03 DIAGNOSIS — S29011A Strain of muscle and tendon of front wall of thorax, initial encounter: Secondary | ICD-10-CM | POA: Diagnosis present

## 2015-06-03 DIAGNOSIS — Z87891 Personal history of nicotine dependence: Secondary | ICD-10-CM | POA: Insufficient documentation

## 2015-06-03 DIAGNOSIS — K219 Gastro-esophageal reflux disease without esophagitis: Secondary | ICD-10-CM | POA: Diagnosis present

## 2015-06-03 HISTORY — DX: Gastro-esophageal reflux disease without esophagitis: K21.9

## 2015-06-03 HISTORY — PX: PECTORALIS TENDON REPAIR: SHX6510

## 2015-06-03 HISTORY — DX: Strain of muscle and tendon of front wall of thorax, initial encounter: S29.011A

## 2015-06-03 SURGERY — REPAIR, TENDON, PECTORALIS
Anesthesia: General | Site: Shoulder | Laterality: Left

## 2015-06-03 MED ORDER — HYDROMORPHONE HCL 1 MG/ML IJ SOLN
INTRAMUSCULAR | Status: AC
  Filled 2015-06-03: qty 1

## 2015-06-03 MED ORDER — OXYCODONE HCL 5 MG PO TABS
5.0000 mg | ORAL_TABLET | Freq: Once | ORAL | Status: AC | PRN
  Administered 2015-06-03: 5 mg via ORAL

## 2015-06-03 MED ORDER — ONDANSETRON HCL 4 MG PO TABS: 4.0000 mg | ORAL_TABLET | Freq: Three times a day (TID) | ORAL | Status: DC | PRN

## 2015-06-03 MED ORDER — OXYCODONE HCL 5 MG PO TABS
ORAL_TABLET | ORAL | Status: AC
  Filled 2015-06-03: qty 1

## 2015-06-03 MED ORDER — OXYCODONE HCL 5 MG PO TABS: 5.0000 mg | ORAL_TABLET | Freq: Once | ORAL | Status: DC

## 2015-06-03 MED ORDER — HYDROMORPHONE HCL 1 MG/ML IJ SOLN
1.0000 mg | Freq: Once | INTRAMUSCULAR | Status: AC
  Administered 2015-06-03: 1 mg via INTRAVENOUS

## 2015-06-03 MED ORDER — MIDAZOLAM HCL 2 MG/2ML IJ SOLN: 1.0000 mg | INTRAMUSCULAR | Status: DC | PRN

## 2015-06-03 MED ORDER — MIDAZOLAM HCL 2 MG/2ML IJ SOLN
INTRAMUSCULAR | Status: AC
  Filled 2015-06-03: qty 2

## 2015-06-03 MED ORDER — DIPHENHYDRAMINE HCL 50 MG/ML IJ SOLN
12.5000 mg | INTRAMUSCULAR | Status: DC | PRN
  Administered 2015-06-03 (×2): 12.5 mg via INTRAVENOUS

## 2015-06-03 MED ORDER — OXYCODONE HCL 5 MG PO TABS: ORAL_TABLET | ORAL | Status: DC

## 2015-06-03 MED ORDER — CEFAZOLIN SODIUM-DEXTROSE 2-3 GM-% IV SOLR
INTRAVENOUS | Status: AC
  Filled 2015-06-03: qty 50

## 2015-06-03 MED ORDER — HYDROMORPHONE HCL 1 MG/ML IJ SOLN
0.2500 mg | INTRAMUSCULAR | Status: DC | PRN
  Administered 2015-06-03 (×4): 0.5 mg via INTRAVENOUS

## 2015-06-03 MED ORDER — CYCLOBENZAPRINE HCL 5 MG PO TABS: 5.0000 mg | ORAL_TABLET | Freq: Three times a day (TID) | ORAL | Status: DC | PRN

## 2015-06-03 MED ORDER — LIDOCAINE HCL (CARDIAC) 20 MG/ML IV SOLN
INTRAVENOUS | Status: AC
  Filled 2015-06-03: qty 5

## 2015-06-03 MED ORDER — MEPERIDINE HCL 25 MG/ML IJ SOLN: 6.2500 mg | INTRAMUSCULAR | Status: DC | PRN

## 2015-06-03 MED ORDER — CEFAZOLIN SODIUM-DEXTROSE 2-3 GM-% IV SOLR
2.0000 g | INTRAVENOUS | Status: AC
  Administered 2015-06-03: 2 g via INTRAVENOUS

## 2015-06-03 MED ORDER — DEXAMETHASONE SODIUM PHOSPHATE 4 MG/ML IJ SOLN
INTRAMUSCULAR | Status: DC | PRN
  Administered 2015-06-03: 10 mg via INTRAVENOUS

## 2015-06-03 MED ORDER — DIPHENHYDRAMINE HCL 50 MG/ML IJ SOLN
INTRAMUSCULAR | Status: AC
  Filled 2015-06-03: qty 1

## 2015-06-03 MED ORDER — SUCCINYLCHOLINE CHLORIDE 20 MG/ML IJ SOLN
INTRAMUSCULAR | Status: AC
  Filled 2015-06-03: qty 1

## 2015-06-03 MED ORDER — ONDANSETRON HCL 4 MG/2ML IJ SOLN
INTRAMUSCULAR | Status: AC
  Filled 2015-06-03: qty 2

## 2015-06-03 MED ORDER — PROPOFOL 10 MG/ML IV BOLUS
INTRAVENOUS | Status: DC | PRN
  Administered 2015-06-03: 300 mg via INTRAVENOUS

## 2015-06-03 MED ORDER — PROMETHAZINE HCL 25 MG/ML IJ SOLN: 6.2500 mg | INTRAMUSCULAR | Status: DC | PRN

## 2015-06-03 MED ORDER — FENTANYL CITRATE (PF) 100 MCG/2ML IJ SOLN
INTRAMUSCULAR | Status: AC
  Filled 2015-06-03: qty 2

## 2015-06-03 MED ORDER — BUPIVACAINE HCL (PF) 0.25 % IJ SOLN
INTRAMUSCULAR | Status: DC | PRN
  Administered 2015-06-03: 20 mL

## 2015-06-03 MED ORDER — OXYCODONE HCL 5 MG/5ML PO SOLN: 5.0000 mg | Freq: Once | ORAL | Status: AC | PRN

## 2015-06-03 MED ORDER — FENTANYL CITRATE (PF) 100 MCG/2ML IJ SOLN
50.0000 ug | INTRAMUSCULAR | Status: AC | PRN
  Administered 2015-06-03: 200 ug via INTRAVENOUS
  Administered 2015-06-03: 25 ug via INTRAVENOUS
  Administered 2015-06-03 (×3): 50 ug via INTRAVENOUS
  Administered 2015-06-03: 25 ug via INTRAVENOUS

## 2015-06-03 MED ORDER — SUCCINYLCHOLINE CHLORIDE 20 MG/ML IJ SOLN
INTRAMUSCULAR | Status: DC | PRN
  Administered 2015-06-03: 40 mg via INTRAVENOUS

## 2015-06-03 MED ORDER — SCOPOLAMINE 1 MG/3DAYS TD PT72
1.0000 | MEDICATED_PATCH | Freq: Once | TRANSDERMAL | Status: DC
Start: 2015-06-03 — End: 2015-06-03

## 2015-06-03 MED ORDER — DEXAMETHASONE SODIUM PHOSPHATE 10 MG/ML IJ SOLN
INTRAMUSCULAR | Status: AC
  Filled 2015-06-03: qty 1

## 2015-06-03 MED ORDER — LACTATED RINGERS IV SOLN
INTRAVENOUS | Status: DC
  Administered 2015-06-03 (×2): via INTRAVENOUS

## 2015-06-03 MED ORDER — GLYCOPYRROLATE 0.2 MG/ML IJ SOLN: 0.2000 mg | Freq: Once | INTRAMUSCULAR | Status: DC | PRN

## 2015-06-03 MED ORDER — PROPOFOL 500 MG/50ML IV EMUL
INTRAVENOUS | Status: AC
  Filled 2015-06-03: qty 50

## 2015-06-03 MED ORDER — ONDANSETRON HCL 4 MG/2ML IJ SOLN
INTRAMUSCULAR | Status: DC | PRN
  Administered 2015-06-03: 4 mg via INTRAVENOUS

## 2015-06-03 MED ORDER — LIDOCAINE HCL (CARDIAC) 20 MG/ML IV SOLN
INTRAVENOUS | Status: DC | PRN
  Administered 2015-06-03: 60 mg via INTRAVENOUS

## 2015-06-03 MED ORDER — CHLORHEXIDINE GLUCONATE 4 % EX LIQD
60.0000 mL | Freq: Once | CUTANEOUS | Status: DC
Start: 2015-06-03 — End: 2015-06-03

## 2015-06-03 SURGICAL SUPPLY — 63 items
BENZOIN TINCTURE PRP APPL 2/3 (GAUZE/BANDAGES/DRESSINGS) ×4 IMPLANT
BLADE HEX COATED 2.75 (ELECTRODE) ×2 IMPLANT
BLADE SURG 15 STRL LF DISP TIS (BLADE) ×2 IMPLANT
BLADE SURG 15 STRL SS (BLADE) ×2
CANISTER SUCT 1200ML W/VALVE (MISCELLANEOUS) ×2 IMPLANT
CLSR STERI-STRIP ANTIMIC 1/2X4 (GAUZE/BANDAGES/DRESSINGS) ×2 IMPLANT
COVER MAYO STAND STRL (DRAPES) ×2 IMPLANT
DECANTER SPIKE VIAL GLASS SM (MISCELLANEOUS) IMPLANT
DRAPE INCISE IOBAN 66X45 STRL (DRAPES) IMPLANT
DRAPE OEC MINIVIEW 54X84 (DRAPES) ×2 IMPLANT
DRAPE SURG 17X23 STRL (DRAPES) IMPLANT
DRAPE U 20/CS (DRAPES) IMPLANT
DRAPE U-SHAPE 47X51 STRL (DRAPES) ×2 IMPLANT
DRAPE U-SHAPE 76X120 STRL (DRAPES) ×4 IMPLANT
DRSG PAD ABDOMINAL 8X10 ST (GAUZE/BANDAGES/DRESSINGS) ×2 IMPLANT
DURAPREP 26ML APPLICATOR (WOUND CARE) ×2 IMPLANT
ELECT REM PT RETURN 9FT ADLT (ELECTROSURGICAL) ×2
ELECTRODE REM PT RTRN 9FT ADLT (ELECTROSURGICAL) ×1 IMPLANT
GAUZE SPONGE 4X4 12PLY STRL (GAUZE/BANDAGES/DRESSINGS) ×2 IMPLANT
GAUZE SPONGE 4X4 16PLY XRAY LF (GAUZE/BANDAGES/DRESSINGS) IMPLANT
GAUZE XEROFORM 1X8 LF (GAUZE/BANDAGES/DRESSINGS) IMPLANT
GLOVE BIO SURGEON STRL SZ7 (GLOVE) ×4 IMPLANT
GLOVE BIOGEL PI IND STRL 7.0 (GLOVE) ×2 IMPLANT
GLOVE BIOGEL PI IND STRL 7.5 (GLOVE) ×2 IMPLANT
GLOVE BIOGEL PI INDICATOR 7.0 (GLOVE) ×2
GLOVE BIOGEL PI INDICATOR 7.5 (GLOVE) ×2
GLOVE ECLIPSE 6.5 STRL STRAW (GLOVE) ×2 IMPLANT
GLOVE SS BIOGEL STRL SZ 7.5 (GLOVE) ×1 IMPLANT
GLOVE SUPERSENSE BIOGEL SZ 7.5 (GLOVE) ×1
GOWN STRL REUS W/ TWL LRG LVL3 (GOWN DISPOSABLE) ×3 IMPLANT
GOWN STRL REUS W/ TWL XL LVL3 (GOWN DISPOSABLE) ×1 IMPLANT
GOWN STRL REUS W/TWL LRG LVL3 (GOWN DISPOSABLE) ×3
GOWN STRL REUS W/TWL XL LVL3 (GOWN DISPOSABLE) ×1
IMPL KIT LG PECW/BUTTON 3PK (Orthopedic Implant) ×1 IMPLANT
IMPLANT KIT LG PECW/BUTTON 3PK (Orthopedic Implant) ×2 IMPLANT
NS IRRIG 1000ML POUR BTL (IV SOLUTION) ×2 IMPLANT
PACK ARTHROSCOPY DSU (CUSTOM PROCEDURE TRAY) ×2 IMPLANT
PACK BASIN DAY SURGERY FS (CUSTOM PROCEDURE TRAY) ×2 IMPLANT
PENCIL BUTTON HOLSTER BLD 10FT (ELECTRODE) ×2 IMPLANT
SHEET MEDIUM DRAPE 40X70 STRL (DRAPES) IMPLANT
SLEEVE SCD COMPRESS KNEE MED (MISCELLANEOUS) ×2 IMPLANT
SLING ARM FOAM STRAP LRG (SOFTGOODS) ×2 IMPLANT
SLING ARM IMMOBILIZER LRG (SOFTGOODS) IMPLANT
SLING ARM IMMOBILIZER MED (SOFTGOODS) IMPLANT
SLING ARM XL FOAM STRAP (SOFTGOODS) IMPLANT
SPONGE LAP 18X18 X RAY DECT (DISPOSABLE) IMPLANT
SPONGE LAP 4X18 X RAY DECT (DISPOSABLE) ×2 IMPLANT
STAPLER VISISTAT 35W (STAPLE) IMPLANT
STRIP CLOSURE SKIN 1/2X4 (GAUZE/BANDAGES/DRESSINGS) ×2 IMPLANT
SUCTION FRAZIER TIP 10 FR DISP (SUCTIONS) ×2 IMPLANT
SUT MNCRL AB 3-0 PS2 18 (SUTURE) ×2 IMPLANT
SUT PROLENE 3 0 PS 2 (SUTURE) IMPLANT
SUT SILK 4 0 TIES 17X18 (SUTURE) IMPLANT
SUT VIC AB 0 CT1 27 (SUTURE)
SUT VIC AB 0 CT1 27XBRD ANBCTR (SUTURE) IMPLANT
SUT VIC AB 2-0 PS2 27 (SUTURE) ×4 IMPLANT
SUT VIC AB 2-0 SH 27 (SUTURE)
SUT VIC AB 2-0 SH 27XBRD (SUTURE) IMPLANT
SUT VICRYL 0 UR6 27IN ABS (SUTURE) ×2 IMPLANT
SYR BULB 3OZ (MISCELLANEOUS) ×2 IMPLANT
TUBE CONNECTING 20X1/4 (TUBING) ×2 IMPLANT
UNDERPAD 30X30 (UNDERPADS AND DIAPERS) ×2 IMPLANT
YANKAUER SUCT BULB TIP NO VENT (SUCTIONS) ×2 IMPLANT

## 2015-06-03 NOTE — Addendum Note (Signed)
Addendum  created 06/03/15 1711 by Karie SchwalbeMary Denisse Whitenack, MD   Modules edited: Clinical Notes   Clinical Notes:  File: [id number]

## 2015-06-03 NOTE — Transfer of Care (Signed)
Immediate Anesthesia Transfer of Care Note  Patient: Jerry DegreeJoseph Carey  Procedure(s) Performed: Procedure(s): LEFT PECTORALIS TENDON REPAIR (Left)  Patient Location: PACU  Anesthesia Type:General  Level of Consciousness: awake, alert , oriented and patient cooperative  Airway & Oxygen Therapy: Patient Spontanous Breathing and Patient connected to face mask oxygen  Post-op Assessment: Report given to RN and Post -op Vital signs reviewed and stable  Post vital signs: Reviewed and stable  Last Vitals:  Filed Vitals:   06/03/15 1019  BP: 141/76  Pulse: 67  Temp: 36.7 C  Resp: 20    Complications: No apparent anesthesia complications

## 2015-06-03 NOTE — Interval H&P Note (Signed)
History and Physical Interval Note:  06/03/2015 7:46 AM  Jerry DegreeJoseph Carey  has presented today for surgery, with the diagnosis of SPONTANEOUS RUPTURE OF OTHER TENDONS, LEFT SHOULDER  The various methods of treatment have been discussed with the patient and family. After consideration of risks, benefits and other options for treatment, the patient has consented to  Procedure(s): LEFT PECTORALIS TENDON REPAIR (Left) as a surgical intervention .  The patient's history has been reviewed, patient examined, no change in status, stable for surgery.  I have reviewed the patient's chart and labs.  Questions were answered to the patient's satisfaction.     Loreta Aveaniel F Rayya Yagi

## 2015-06-03 NOTE — Interval H&P Note (Signed)
History and Physical Interval Note:  06/03/2015 11:04 AM  Jerry Carey Genis  has presented today for surgery, with the diagnosis of SPONTANEOUS RUPTURE OF OTHER TENDONS, LEFT SHOULDER  The various methods of treatment have been discussed with the patient and family. After consideration of risks, benefits and other options for treatment, the patient has consented to  Procedure(s): LEFT PECTORALIS TENDON REPAIR (Left) as a surgical intervention .  The patient's history has been reviewed, patient examined, no change in status, stable for surgery.  I have reviewed the patient's chart and labs.  Questions were answered to the patient's satisfaction.     Salvatore MarvelWAINER,Alexas Basulto A

## 2015-06-03 NOTE — Anesthesia Preprocedure Evaluation (Signed)
Anesthesia Evaluation  Patient identified by MRN, date of birth, ID band Patient awake    Reviewed: Allergy & Precautions, NPO status , Patient's Chart, lab work & pertinent test results  History of Anesthesia Complications Negative for: history of anesthetic complications  Airway Mallampati: II  TM Distance: >3 FB Neck ROM: Full    Dental no notable dental hx. (+) Dental Advisory Given   Pulmonary neg pulmonary ROS, former smoker,    Pulmonary exam normal breath sounds clear to auscultation       Cardiovascular negative cardio ROS Normal cardiovascular exam Rhythm:Regular Rate:Normal     Neuro/Psych negative neurological ROS  negative psych ROS   GI/Hepatic Neg liver ROS, GERD  ,  Endo/Other  negative endocrine ROS  Renal/GU negative Renal ROS  negative genitourinary   Musculoskeletal negative musculoskeletal ROS (+)   Abdominal   Peds negative pediatric ROS (+)  Hematology negative hematology ROS (+)   Anesthesia Other Findings Hx of recreational steroid use, last use >one week ago  Reproductive/Obstetrics negative OB ROS                             Anesthesia Physical Anesthesia Plan  ASA: II  Anesthesia Plan: General   Post-op Pain Management:    Induction: Intravenous  Airway Management Planned: LMA  Additional Equipment:   Intra-op Plan:   Post-operative Plan: Extubation in OR  Informed Consent: I have reviewed the patients History and Physical, chart, labs and discussed the procedure including the risks, benefits and alternatives for the proposed anesthesia with the patient or authorized representative who has indicated his/her understanding and acceptance.   Dental advisory given  Plan Discussed with: CRNA  Anesthesia Plan Comments:         Anesthesia Quick Evaluation

## 2015-06-03 NOTE — Discharge Instructions (Signed)
Pectoralis Major Repair  WEAR YOUR SLING AT ALL TIMES.  DO NOT REMOVE BANDAGE.  DO NOT GET BANDAGE WET.  MAY APPLY ICE FOR UP TO 20 MINUTES AT A TIME FOR PAIN AND SWELLING.  FOLLOW UP APPOINTMENT IN ONE WEEK.  SEEK MEDICAL CARE IF: You have swelling of your calf or leg.  You develop shortness of breath or chest pain.  You have redness, swelling, or increasing pain in the wound.  There is pus or any unusual drainage coming from the surgical site.  You notice a bad smell coming from the surgical site or dressing.  The surgical site breaks open after sutures or staples have been removed.  There is persistent bleeding from the suture or staple line.  You are getting worse or are not improving.  You have any other questions or concerns.  SEEK IMMEDIATE MEDICAL CARE IF:  You have a fever greater than 101 You develop a rash.  You have difficulty breathing.  You develop any reaction or side effects to medicines given.  Your knee motion is decreasing rather than improving.  MAKE SURE YOU:  Understand these instructions.  Will watch your condition.  Will get help right away if you are not doing well or get worse.   Post Anesthesia Home Care Instructions  Activity: Get plenty of rest for the remainder of the day. A responsible adult should stay with you for 24 hours following the procedure.  For the next 24 hours, DO NOT: -Drive a car -Advertising copywriterperate machinery -Drink alcoholic beverages -Take any medication unless instructed by your physician -Make any legal decisions or sign important papers.  Meals: Start with liquid foods such as gelatin or soup. Progress to regular foods as tolerated. Avoid greasy, spicy, heavy foods. If nausea and/or vomiting occur, drink only clear liquids until the nausea and/or vomiting subsides. Call your physician if vomiting continues.  Special Instructions/Symptoms: Your throat may feel dry or sore from the anesthesia or the breathing tube placed in your throat  during surgery. If this causes discomfort, gargle with warm salt water. The discomfort should disappear within 24 hours.  If you had a scopolamine patch placed behind your ear for the management of post- operative nausea and/or vomiting:  1. The medication in the patch is effective for 72 hours, after which it should be removed.  Wrap patch in a tissue and discard in the trash. Wash hands thoroughly with soap and water. 2. You may remove the patch earlier than 72 hours if you experience unpleasant side effects which may include dry mouth, dizziness or visual disturbances. 3. Avoid touching the patch. Wash your hands with soap and water after contact with the patch.

## 2015-06-03 NOTE — Anesthesia Procedure Notes (Signed)
Procedure Name: Intubation Date/Time: 06/03/2015 12:26 PM Performed by: Whitni Pasquini D Pre-anesthesia Checklist: Patient identified, Emergency Drugs available, Suction available and Patient being monitored Patient Re-evaluated:Patient Re-evaluated prior to inductionOxygen Delivery Method: Circle System Utilized Preoxygenation: Pre-oxygenation with 100% oxygen Intubation Type: IV induction Ventilation: Mask ventilation without difficulty Laryngoscope Size: Mac and 3 Grade View: Grade I Tube type: Oral Tube size: 7.0 mm Number of attempts: 1 Airway Equipment and Method: Stylet and Oral airway Placement Confirmation: ETT inserted through vocal cords under direct vision,  positive ETCO2 and breath sounds checked- equal and bilateral Secured at: 22 cm Tube secured with: Tape Dental Injury: Teeth and Oropharynx as per pre-operative assessment

## 2015-06-03 NOTE — Anesthesia Postprocedure Evaluation (Addendum)
Anesthesia Post Note  Patient: Jerry Carey  Procedure(s) Performed: Procedure(s) (LRB): LEFT PECTORALIS TENDON REPAIR (Left)  Patient location during evaluation: PACU Anesthesia Type: General Level of consciousness: awake and alert Pain management: pain level controlled Vital Signs Assessment: post-procedure vital signs reviewed and stable Respiratory status: spontaneous breathing, nonlabored ventilation, respiratory function stable and patient connected to nasal cannula oxygen Cardiovascular status: blood pressure returned to baseline and stable Postop Assessment: no signs of nausea or vomiting Anesthetic complications: no Comments: At initial time of this post op note, his pain score was a 4. He was sitting up and having a very pleasant conversation with everyone. He was apologizing for his behavior upon emergence of anesthesia. He was joking about sayings from the [place]. When he moved to phase 2, I was called to evaluate his pain. He said his shoulder had started to hurt again. He had just received some oxycodone. He had said that the dilaudid he received in the main PACU had significantly helped his pain. I instructed the nurse to give him another 1mg  dose of dilaudid instructing the patient that this could bridge his pain until the oral medication had a chance to work. We would reevaluate his pain in alittle while. I was called away to evaluate a cardiac emergency in the PACU and when I returned, he had been discharged. I was told by his nurse that his pain had improved slightly and that he was still hurting but wanted to go home and did not wish to remain in the hospital. Throughout his PACU stay, he was very rude to staff and then would immediately apologize for his behavior. He would be hurting one minute and then jovial the next. I was certainly addressing his pain and gave him additional pain medication when needed. He desired to leave rather than stay for any additional medications.      Last Vitals:  Filed Vitals:   06/03/15 1445 06/03/15 1501  BP: 168/67 169/54  Pulse: 78 75  Temp:    Resp: 7 11    Last Pain:  Filed Vitals:   06/03/15 1508  PainSc: 4                  Jerry Carey

## 2015-06-04 ENCOUNTER — Encounter (HOSPITAL_BASED_OUTPATIENT_CLINIC_OR_DEPARTMENT_OTHER): Payer: Self-pay | Admitting: Orthopedic Surgery

## 2015-06-04 ENCOUNTER — Ambulatory Visit (HOSPITAL_COMMUNITY): Payer: Self-pay

## 2015-06-04 NOTE — Op Note (Signed)
NAMKatina Degree:  [place], [name]           ACCOUNT NO.:  [id number]  MEDICAL RECORD NO.:  [id number]  LOCATION:                               FACILITY:  [facility]  PHYSICIAN:  Elana Almobert A. Thurston HoleWainer, M.D. DATE OF BIRTH:  December 31, 1970  DATE OF PROCEDURE:  06/03/2015 DATE OF DISCHARGE:  06/03/2015                              OPERATIVE REPORT   PREOPERATIVE DIAGNOSIS:  Left pectoralis major muscle/tendon acute traumatic rupture.  POSTOPERATIVE DIAGNOSIS:  Left pectoralis major muscle/tendon acute traumatic rupture.  PROCEDURE:  Left pectoralis major muscle/tendon repair.  SURGEON:  Elana Almobert A. Thurston HoleWainer, M.D.  ASSISTANT:  Loreta Aveaniel F. Murphy, M.D.  ANESTHESIA:  General.  OPERATIVE TIME:  One hour.  COMPLICATIONS:  None.  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  Exam and MRI have confirmed this.  He is now to undergo primary repair.  DESCRIPTION OF PROCEDURE:  Jerry Carey was brought to the operating room on June 03, 2015, placed on operative table in supine position. He received antibiotics preoperatively for prophylaxis.  After being placed under general anesthesia, his left shoulder was examined.  He had full range of motion with an obvious distal pectoralis rupture.  His left shoulder and chest and arm were prepped using sterile DuraPrep and draped using sterile technique.  Time-out procedure was called and the correct left shoulder identified.  Initially, through a 4-5 cm anterior axillary incision, initial exposure was made.  The underlying subcutaneous tissues were incised along with skin incision.  The fascia over the pectoralis was incised longitudinally, and then seroma was found, and the complete ruptured muscle and tendon unit was identified. The edges were debrided and then 2 separate Arthrex FiberTapes were placed in a mattress suture  technique through the muscle and tendon unit.  This gave excellent fixation of the distal ruptured end.  The anatomic attachment site was then exposed just lateral to the biceps tendon and this area was stripped off periosteum and then 2 separate drill holes were placed for the Arthrex anchors.  After this was done and the anchors were loaded and with the arm internally rotated, the anchors were deployed in these 2 holes.  After this was done, then sequentially the muscle tendon unit with the sutures were brought down to the anatomic bony attachment with the arm internally rotated with excellent and firm repair.  This completely restored the anatomy to normal.  The neurovascular structures had been carefully protected during the procedure.  At this point, there was found to be an excellent tight and anatomic repair.  The wound was then irrigated and then closed with 2-0 Vicryl in the subcutaneous tissues and 4-0 Monocryl in the subcuticular layer.  The wound injected with 0.25% Marcaine.  Sterile dressings and sling applied.  Then, the patient awakened and taken to recovery room in stable condition.  Needle and sponge counts were correct x2 at the end of the case.  Follow up care, Jerry Carey will be followed as an outpatient on oxycodone and Valium and a sling.  He will be seen back in the office in a week for recheck  and followup.     Cruz Devilla A. Thurston Hole, M.D.     RAW/MEDQ  D:  06/03/2015  T:  06/04/2015  Job:  [id number]

## 2015-06-18 ENCOUNTER — Encounter (HOSPITAL_COMMUNITY): Payer: Self-pay | Admitting: Nurse Practitioner

## 2015-06-18 ENCOUNTER — Emergency Department (HOSPITAL_COMMUNITY)
Admission: EM | Admit: 2015-06-18 | Discharge: 2015-06-18 | Disposition: A | Discharge status: Home / Self Care | Payer: Self-pay | Attending: Emergency Medicine | Admitting: Emergency Medicine

## 2015-06-18 DIAGNOSIS — Y658 Other specified misadventures during surgical and medical care: Secondary | ICD-10-CM | POA: Insufficient documentation

## 2015-06-18 DIAGNOSIS — K219 Gastro-esophageal reflux disease without esophagitis: Secondary | ICD-10-CM | POA: Insufficient documentation

## 2015-06-18 DIAGNOSIS — Z87891 Personal history of nicotine dependence: Secondary | ICD-10-CM | POA: Insufficient documentation

## 2015-06-18 DIAGNOSIS — T8131XA Disruption of external operation (surgical) wound, not elsewhere classified, initial encounter: Secondary | ICD-10-CM | POA: Insufficient documentation

## 2015-06-18 DIAGNOSIS — Z79899 Other long term (current) drug therapy: Secondary | ICD-10-CM | POA: Insufficient documentation

## 2015-06-18 DIAGNOSIS — Z9889 Other specified postprocedural states: Secondary | ICD-10-CM | POA: Insufficient documentation

## 2015-06-18 MED ORDER — CEPHALEXIN 500 MG PO CAPS: 500.0000 mg | ORAL_CAPSULE | Freq: Three times a day (TID) | ORAL | Status: DC

## 2015-06-18 NOTE — Progress Notes (Signed)
Patient listed as [place] having insurance [place]  EDCM spoke to patient at bedside.  Patient confirms his pcp is Dr. Juleen ChinaKohut.  Patient reports he has been working with the financial counselors at [facility] to assist with his medicals bills.  No further EDCM needs at this time.

## 2015-06-18 NOTE — Discharge Instructions (Signed)
Continue applying ice on it.   Stop naprosyn for 2 days.   Keep wound clean and dry.  Continue taking pain meds as prescribed.   Please see Dr. Thurston HoleWainer next week for follow up.   No heavy lifting.   Take keflex only if you have redness around the wound, purulent discharge from wound.   Return to ER if you have fever, drainage from the wound, bleeding from wound, wound coming apart.

## 2015-06-18 NOTE — ED Notes (Signed)
Bed: WA02 Expected date:  Expected time:  Means of arrival:  Comments: Bleeding s/p surgery

## 2015-06-18 NOTE — ED Provider Notes (Signed)
CSN: [id number]     Arrival date & time 06/18/15  2008 History   First MD Initiated Contact with Patient 06/18/15 2011     Chief Complaint  Patient presents with  . Post-op Problem    Prectorial tendon repair surgery     (Consider location/radiation/quality/duration/timing/severity/associated sxs/prior Treatment) The history is provided by the patient.  Jerry Carey is a 44 y.o. male hx of GERD, pectoralis muscle rupture status post surgery here presenting with bleeding from the wound. He states that he actually saw his orthopedic surgeon yesterday. He had a tape that was removed from his wound yesterday. He states that he began woozing today. Denies any heavy lifting. Denies worsening pain or swelling. He is professional weight lifter.    Past Medical History  Diagnosis Date  . GERD (gastroesophageal reflux disease)   . Pectoralis muscle rupture     left   Past Surgical History  Procedure Laterality Date  . Wisdom tooth extraction    . Tonsillectomy    . Pectoralis tendon repair Left 06/03/2015    Procedure: LEFT PECTORALIS TENDON REPAIR;  Surgeon: Salvatore Marvel, MD;  Location: [facility];  Service: Orthopedics;  Laterality: Left;   History reviewed. No pertinent family history. Social History  Substance Use Topics  . Smoking status: Former Smoker    Types: Cigarettes    Quit date: 11/28/2008  . Smokeless tobacco: None  . Alcohol Use: Yes     Comment: social    Review of Systems  Skin: Positive for wound.  All other systems reviewed and are negative.     Allergies  Review of patient's allergies indicates no known allergies.  Home Medications   Prior to Admission medications   Medication Sig Start Date End Date Taking? Authorizing Provider  cyclobenzaprine (FLEXERIL) 5 MG tablet Take 1 tablet (5 mg total) by mouth 3 (three) times daily as needed for muscle spasms. 06/03/15   Cristie Hem, PA-C  loratadine (CLARITIN) 10 MG tablet Take 10 mg  by mouth daily.    Historical Provider, MD  Multiple Vitamin (MULTIVITAMIN WITH MINERALS) TABS tablet Take 1 tablet by mouth daily.    Historical Provider, MD  ondansetron (ZOFRAN) 4 MG tablet Take 1 tablet (4 mg total) by mouth every 8 (eight) hours as needed for nausea or vomiting. 06/03/15   Cristie Hem, PA-C  oxyCODONE (ROXICODONE) 5 MG immediate release tablet Take 1-2 tabs po q4-6 hours prn pain 06/03/15   Cristie Hem, PA-C  ranitidine (ZANTAC) 150 MG tablet Take 150 mg by mouth 2 (two) times daily.    Historical Provider, MD   BP 137/82 mmHg  Pulse 92  Temp(Src) 98.9 F (37.2 C) (Oral)  Resp 21  SpO2 99% Physical Exam  Constitutional: He is oriented to person, place, and time. He appears well-developed and well-nourished.  HENT:  Head: Normocephalic.  Eyes: Pupils are equal, round, and reactive to light.  Neck: Normal range of motion.  Cardiovascular: Normal rate, regular rhythm and normal heart sounds.   Pulmonary/Chest: Effort normal and breath sounds normal.  L chest surgical scar healing well. On the inferior aspect, there is some oozing. The wound comes apart slightly when I try to pull it apart   Abdominal: Soft. Bowel sounds are normal. He exhibits no distension. There is no tenderness. There is no rebound.  Musculoskeletal: Normal range of motion.  Neurological: He is alert and oriented to person, place, and time.  Skin: Skin is warm and dry.  Psychiatric: He has a normal mood and affect. His behavior is normal. Judgment and thought content normal.  Nursing note and vitals reviewed.   ED Course  Wound repair Date/Time: 06/18/2015 9:23 PM Performed by: Richardean CanalYAO, Sumayah Bearse H Authorized by: Richardean CanalYAO, Armella Stogner H Consent: Verbal consent obtained. Risks and benefits: risks, benefits and alternatives were discussed Consent given by: patient Patient understanding: patient states understanding of the procedure being performed Patient consent: the patient's understanding of the  procedure matches consent given Procedure consent: procedure consent matches procedure scheduled Time out: Immediately prior to procedure a "time out" was called to verify the correct patient, procedure, equipment, support staff and site/side marked as required. Local anesthesia used: no Patient sedated: no Patient tolerance: Patient tolerated the procedure well with no immediate complications Comments: Dermabond applied    (including critical care time)    Labs Review Labs Reviewed - No data to display  Imaging Review No results found. I have personally reviewed and evaluated these images and lab results as part of my medical decision-making.   EKG Interpretation None      MDM   Final diagnoses:  None   Jerry Carey is a 44 y.o. male here with bleeding from surgical site with slight wound dehiscence. Will consult ortho and will try to control bleeding.    9:23 PM  I applied dermabond and wound seal and steri strips and bleeding controlled. Recommend ice, continue pain meds at home. I notified Dr. Dion SaucierLandau, who is covering Dr. Thurston HoleWainer, who recommend f/u next week.     Richardean Canalavid H Fermina Mishkin, MD 06/18/15 2125

## 2015-06-18 NOTE — ED Notes (Signed)
Pt presents with what seems to be a dehisced surgical incision from a a recent pectoral tendon surgery repair per chart review. EDP at bedside assessing pt on arrival.

## 2015-06-23 ENCOUNTER — Encounter (HOSPITAL_COMMUNITY): Payer: Self-pay | Admitting: *Deleted

## 2015-06-24 ENCOUNTER — Ambulatory Visit (HOSPITAL_BASED_OUTPATIENT_CLINIC_OR_DEPARTMENT_OTHER)
Admission: RE | Admit: 2015-06-24 | Discharge: 2015-06-25 | Disposition: A | Discharge status: Home / Self Care | Payer: Self-pay | Source: Ambulatory Visit | Attending: Orthopedic Surgery | Admitting: Orthopedic Surgery

## 2015-06-24 ENCOUNTER — Ambulatory Visit (HOSPITAL_BASED_OUTPATIENT_CLINIC_OR_DEPARTMENT_OTHER): Payer: Self-pay | Admitting: Certified Registered"

## 2015-06-24 ENCOUNTER — Encounter (HOSPITAL_BASED_OUTPATIENT_CLINIC_OR_DEPARTMENT_OTHER)
Admission: RE | Disposition: A | Discharge status: Home / Self Care | Payer: Self-pay | Source: Ambulatory Visit | Attending: Orthopedic Surgery

## 2015-06-24 ENCOUNTER — Encounter (HOSPITAL_BASED_OUTPATIENT_CLINIC_OR_DEPARTMENT_OTHER): Payer: Self-pay | Admitting: Anesthesiology

## 2015-06-24 DIAGNOSIS — M96840 Postprocedural hematoma of a musculoskeletal structure following a musculoskeletal system procedure: Secondary | ICD-10-CM | POA: Insufficient documentation

## 2015-06-24 DIAGNOSIS — L02414 Cutaneous abscess of left upper limb: Secondary | ICD-10-CM

## 2015-06-24 DIAGNOSIS — T8149XA Infection following a procedure, other surgical site, initial encounter: Secondary | ICD-10-CM | POA: Diagnosis present

## 2015-06-24 DIAGNOSIS — Y838 Other surgical procedures as the cause of abnormal reaction of the patient, or of later complication, without mention of misadventure at the time of the procedure: Secondary | ICD-10-CM | POA: Insufficient documentation

## 2015-06-24 DIAGNOSIS — Z87891 Personal history of nicotine dependence: Secondary | ICD-10-CM | POA: Insufficient documentation

## 2015-06-24 HISTORY — DX: Cutaneous abscess of left upper limb: L02.414

## 2015-06-24 HISTORY — PX: INCISION AND DRAINAGE: SHX5863

## 2015-06-24 SURGERY — INCISION AND DRAINAGE
Anesthesia: General | Site: Chest | Laterality: Left

## 2015-06-24 MED ORDER — METOCLOPRAMIDE HCL 5 MG PO TABS: 5.0000 mg | ORAL_TABLET | Freq: Three times a day (TID) | ORAL | Status: DC | PRN

## 2015-06-24 MED ORDER — ACETAMINOPHEN 325 MG PO TABS: 650.0000 mg | ORAL_TABLET | Freq: Four times a day (QID) | ORAL | Status: DC | PRN

## 2015-06-24 MED ORDER — MIDAZOLAM HCL 2 MG/2ML IJ SOLN
INTRAMUSCULAR | Status: AC
  Filled 2015-06-24: qty 2

## 2015-06-24 MED ORDER — CEFAZOLIN SODIUM-DEXTROSE 2-3 GM-% IV SOLR
INTRAVENOUS | Status: AC
  Filled 2015-06-24: qty 50

## 2015-06-24 MED ORDER — OXYCODONE-ACETAMINOPHEN 5-325 MG PO TABS: 1.0000 | ORAL_TABLET | Freq: Four times a day (QID) | ORAL | Status: DC | PRN

## 2015-06-24 MED ORDER — FENTANYL CITRATE (PF) 100 MCG/2ML IJ SOLN
INTRAMUSCULAR | Status: AC
  Filled 2015-06-24: qty 2

## 2015-06-24 MED ORDER — PROPOFOL 10 MG/ML IV BOLUS
INTRAVENOUS | Status: DC | PRN
  Administered 2015-06-24: 200 mg via INTRAVENOUS

## 2015-06-24 MED ORDER — FENTANYL CITRATE (PF) 100 MCG/2ML IJ SOLN
25.0000 ug | INTRAMUSCULAR | Status: DC | PRN
  Administered 2015-06-24: 50 ug via INTRAVENOUS
  Administered 2015-06-24: 25 ug via INTRAVENOUS

## 2015-06-24 MED ORDER — CEFAZOLIN SODIUM-DEXTROSE 2-3 GM-% IV SOLR
2.0000 g | INTRAVENOUS | Status: AC
  Administered 2015-06-24: 2 g via INTRAVENOUS

## 2015-06-24 MED ORDER — OXYCODONE HCL 5 MG/5ML PO SOLN: 5.0000 mg | Freq: Once | ORAL | Status: DC | PRN

## 2015-06-24 MED ORDER — ONDANSETRON HCL 4 MG/2ML IJ SOLN: 4.0000 mg | Freq: Four times a day (QID) | INTRAMUSCULAR | Status: DC | PRN

## 2015-06-24 MED ORDER — OXYMETAZOLINE HCL 0.05 % NA SOLN
2.0000 | Freq: Two times a day (BID) | NASAL | Status: DC | PRN
  Administered 2015-06-24: 2 via NASAL

## 2015-06-24 MED ORDER — MIDAZOLAM HCL 2 MG/2ML IJ SOLN
1.0000 mg | INTRAMUSCULAR | Status: DC | PRN
  Administered 2015-06-24: 2 mg via INTRAVENOUS

## 2015-06-24 MED ORDER — PHENYLEPHRINE HCL 10 MG/ML IJ SOLN
INTRAMUSCULAR | Status: DC | PRN
  Administered 2015-06-24: 80 ug via INTRAVENOUS

## 2015-06-24 MED ORDER — EPHEDRINE SULFATE 50 MG/ML IJ SOLN
INTRAMUSCULAR | Status: DC | PRN
  Administered 2015-06-24: 10 mg via INTRAVENOUS

## 2015-06-24 MED ORDER — DIPHENHYDRAMINE HCL 50 MG/ML IJ SOLN
INTRAMUSCULAR | Status: DC | PRN
  Administered 2015-06-24: 25 mg via INTRAVENOUS

## 2015-06-24 MED ORDER — LIDOCAINE HCL (CARDIAC) 20 MG/ML IV SOLN
INTRAVENOUS | Status: AC
  Filled 2015-06-24: qty 5

## 2015-06-24 MED ORDER — FENTANYL CITRATE (PF) 100 MCG/2ML IJ SOLN
50.0000 ug | INTRAMUSCULAR | Status: DC | PRN
  Administered 2015-06-24: 100 ug via INTRAVENOUS

## 2015-06-24 MED ORDER — VANCOMYCIN HCL IN DEXTROSE 1-5 GM/200ML-% IV SOLN
INTRAVENOUS | Status: AC
  Filled 2015-06-24: qty 200

## 2015-06-24 MED ORDER — OXYCODONE HCL 5 MG PO TABS: 5.0000 mg | ORAL_TABLET | Freq: Once | ORAL | Status: DC | PRN

## 2015-06-24 MED ORDER — GLYCOPYRROLATE 0.2 MG/ML IJ SOLN: 0.2000 mg | Freq: Once | INTRAMUSCULAR | Status: DC | PRN

## 2015-06-24 MED ORDER — OXYMETAZOLINE HCL 0.05 % NA SOLN
NASAL | Status: AC
  Filled 2015-06-24: qty 15

## 2015-06-24 MED ORDER — OXYCODONE HCL 5 MG PO TABS
5.0000 mg | ORAL_TABLET | ORAL | Status: DC | PRN
  Administered 2015-06-24: 10 mg via ORAL
  Administered 2015-06-24 (×2): 5 mg via ORAL
  Administered 2015-06-24: 10 mg via ORAL
  Administered 2015-06-25 (×2): 5 mg via ORAL
  Filled 2015-06-24 (×2): qty 2
  Filled 2015-06-24 (×2): qty 1
  Filled 2015-06-24: qty 2
  Filled 2015-06-24: qty 1

## 2015-06-24 MED ORDER — SCOPOLAMINE 1 MG/3DAYS TD PT72: 1.0000 | MEDICATED_PATCH | Freq: Once | TRANSDERMAL | Status: DC | PRN

## 2015-06-24 MED ORDER — SODIUM CHLORIDE 0.9 % IR SOLN
Status: DC | PRN
  Administered 2015-06-24: 6000 mL

## 2015-06-24 MED ORDER — FAMOTIDINE 20 MG PO TABS: 20.0000 mg | ORAL_TABLET | Freq: Every day | ORAL | Status: DC

## 2015-06-24 MED ORDER — ALPRAZOLAM 0.5 MG PO TABS
0.5000 mg | ORAL_TABLET | Freq: Three times a day (TID) | ORAL | Status: DC | PRN
  Administered 2015-06-25: 1 mg via ORAL
  Filled 2015-06-24: qty 2

## 2015-06-24 MED ORDER — LORATADINE 10 MG PO TABS: 10.0000 mg | ORAL_TABLET | Freq: Every day | ORAL | Status: DC | PRN

## 2015-06-24 MED ORDER — ONDANSETRON HCL 4 MG/2ML IJ SOLN
INTRAMUSCULAR | Status: DC | PRN
  Administered 2015-06-24: 4 mg via INTRAVENOUS

## 2015-06-24 MED ORDER — METOCLOPRAMIDE HCL 5 MG/ML IJ SOLN: 5.0000 mg | Freq: Three times a day (TID) | INTRAMUSCULAR | Status: DC | PRN

## 2015-06-24 MED ORDER — CEFAZOLIN SODIUM 1-5 GM-% IV SOLN
INTRAVENOUS | Status: AC
  Filled 2015-06-24: qty 50

## 2015-06-24 MED ORDER — ALPRAZOLAM 0.5 MG PO TABS: 0.5000 mg | ORAL_TABLET | Freq: Three times a day (TID) | ORAL | Status: DC | PRN

## 2015-06-24 MED ORDER — ADULT MULTIVITAMIN W/MINERALS CH: 1.0000 | ORAL_TABLET | Freq: Every day | ORAL | Status: DC

## 2015-06-24 MED ORDER — ONDANSETRON HCL 4 MG PO TABS: 4.0000 mg | ORAL_TABLET | Freq: Four times a day (QID) | ORAL | Status: DC | PRN

## 2015-06-24 MED ORDER — ACETAMINOPHEN 650 MG RE SUPP: 650.0000 mg | Freq: Four times a day (QID) | RECTAL | Status: DC | PRN

## 2015-06-24 MED ORDER — PROPOFOL 500 MG/50ML IV EMUL
INTRAVENOUS | Status: AC
  Filled 2015-06-24: qty 50

## 2015-06-24 MED ORDER — VANCOMYCIN HCL IN DEXTROSE 1-5 GM/200ML-% IV SOLN
1000.0000 mg | Freq: Two times a day (BID) | INTRAVENOUS | Status: AC
  Administered 2015-06-24: 1000 mg via INTRAVENOUS

## 2015-06-24 MED ORDER — HYDROMORPHONE HCL 1 MG/ML IJ SOLN
0.5000 mg | INTRAMUSCULAR | Status: DC | PRN
  Administered 2015-06-24 (×2): 1 mg via INTRAVENOUS
  Filled 2015-06-24 (×2): qty 1

## 2015-06-24 MED ORDER — DEXMEDETOMIDINE HCL IN NACL 200 MCG/50ML IV SOLN
INTRAVENOUS | Status: DC | PRN
  Administered 2015-06-24: 100 ug via INTRAVENOUS

## 2015-06-24 MED ORDER — CYCLOBENZAPRINE HCL 10 MG PO TABS
5.0000 mg | ORAL_TABLET | Freq: Three times a day (TID) | ORAL | Status: DC | PRN
  Administered 2015-06-24 (×2): 5 mg via ORAL
  Filled 2015-06-24 (×2): qty 1

## 2015-06-24 MED ORDER — LIDOCAINE HCL (CARDIAC) 20 MG/ML IV SOLN
INTRAVENOUS | Status: DC | PRN
  Administered 2015-06-24: 80 mg via INTRAVENOUS

## 2015-06-24 MED ORDER — ONDANSETRON HCL 4 MG/2ML IJ SOLN
INTRAMUSCULAR | Status: AC
  Filled 2015-06-24: qty 2

## 2015-06-24 MED ORDER — LACTATED RINGERS IV SOLN
INTRAVENOUS | Status: DC
  Administered 2015-06-24 (×2): via INTRAVENOUS

## 2015-06-24 MED ORDER — CEFAZOLIN SODIUM-DEXTROSE 2-3 GM-% IV SOLR
2.0000 g | Freq: Four times a day (QID) | INTRAVENOUS | Status: AC
  Administered 2015-06-24 – 2015-06-25 (×4): 2 g via INTRAVENOUS
  Filled 2015-06-24 (×4): qty 50

## 2015-06-24 SURGICAL SUPPLY — 59 items
BANDAGE ACE 6X5 VEL STRL LF (GAUZE/BANDAGES/DRESSINGS) IMPLANT
BANDAGE ESMARK 6X9 LF (GAUZE/BANDAGES/DRESSINGS) ×1 IMPLANT
BLADE SURG 15 STRL LF DISP TIS (BLADE) ×1 IMPLANT
BLADE SURG 15 STRL SS (BLADE) ×1
BNDG COHESIVE 4X5 TAN STRL (GAUZE/BANDAGES/DRESSINGS) ×2 IMPLANT
BNDG ESMARK 6X9 LF (GAUZE/BANDAGES/DRESSINGS) ×2
CANISTER SUCT 1200ML W/VALVE (MISCELLANEOUS) ×2 IMPLANT
CLSR STERI-STRIP ANTIMIC 1/2X4 (GAUZE/BANDAGES/DRESSINGS) IMPLANT
COVER BACK TABLE 60X90IN (DRAPES) ×2 IMPLANT
DRAIN PENROSE 1/4X12 LTX STRL (WOUND CARE) ×2 IMPLANT
DRAPE EXTREMITY T 121X128X90 (DRAPE) ×2 IMPLANT
DRAPE U 20/CS (DRAPES) ×4 IMPLANT
DRAPE U-SHAPE 47X51 STRL (DRAPES) ×2 IMPLANT
DRAPE U-SHAPE 76X120 STRL (DRAPES) ×4 IMPLANT
DRSG PAD ABDOMINAL 8X10 ST (GAUZE/BANDAGES/DRESSINGS) IMPLANT
ELECT REM PT RETURN 9FT ADLT (ELECTROSURGICAL) ×2
ELECTRODE REM PT RTRN 9FT ADLT (ELECTROSURGICAL) ×1 IMPLANT
GAUZE SPONGE 4X4 12PLY STRL (GAUZE/BANDAGES/DRESSINGS) ×2 IMPLANT
GLOVE BIO SURGEON STRL SZ7 (GLOVE) ×2 IMPLANT
GLOVE BIO SURGEON STRL SZ8 (GLOVE) ×2 IMPLANT
GLOVE BIO SURGEON STRL SZ8.5 (GLOVE) ×2 IMPLANT
GLOVE BIOGEL PI IND STRL 7.0 (GLOVE) ×1 IMPLANT
GLOVE BIOGEL PI IND STRL 7.5 (GLOVE) ×1 IMPLANT
GLOVE BIOGEL PI IND STRL 8 (GLOVE) ×2 IMPLANT
GLOVE BIOGEL PI INDICATOR 7.0 (GLOVE) ×1
GLOVE BIOGEL PI INDICATOR 7.5 (GLOVE) ×1
GLOVE BIOGEL PI INDICATOR 8 (GLOVE) ×2
GLOVE ORTHO TXT STRL SZ7.5 (GLOVE) ×2 IMPLANT
GOWN STRL REUS W/ TWL LRG LVL3 (GOWN DISPOSABLE) ×1 IMPLANT
GOWN STRL REUS W/ TWL XL LVL3 (GOWN DISPOSABLE) ×2 IMPLANT
GOWN STRL REUS W/TWL LRG LVL3 (GOWN DISPOSABLE) ×1
GOWN STRL REUS W/TWL XL LVL3 (GOWN DISPOSABLE) ×2
IV NS 1000ML (IV SOLUTION) ×6
IV NS 1000ML BAXH (IV SOLUTION) ×6 IMPLANT
NEEDLE HYPO 25X1 1.5 SAFETY (NEEDLE) IMPLANT
NS IRRIG 1000ML POUR BTL (IV SOLUTION) ×2 IMPLANT
PACK BASIN DAY SURGERY FS (CUSTOM PROCEDURE TRAY) ×2 IMPLANT
PENCIL BUTTON HOLSTER BLD 10FT (ELECTRODE) ×2 IMPLANT
SLEEVE SCD COMPRESS KNEE MED (MISCELLANEOUS) ×2 IMPLANT
SLING ARM IMMOBILIZER LRG (SOFTGOODS) ×2 IMPLANT
SPONGE LAP 18X18 X RAY DECT (DISPOSABLE) ×4 IMPLANT
STAPLER VISISTAT 35W (STAPLE) IMPLANT
STOCKINETTE IMPERVIOUS LG (DRAPES) ×2 IMPLANT
SUT ETHILON 3 0 PS 1 (SUTURE) ×2 IMPLANT
SUT ETHILON 4 0 PS 2 18 (SUTURE) IMPLANT
SUT MNCRL AB 4-0 PS2 18 (SUTURE) IMPLANT
SUT VIC AB 0 CT1 27 (SUTURE)
SUT VIC AB 0 CT1 27XBRD ANBCTR (SUTURE) IMPLANT
SUT VIC AB 2-0 SH 18 (SUTURE) IMPLANT
SUT VIC AB 3-0 SH 27 (SUTURE)
SUT VIC AB 3-0 SH 27X BRD (SUTURE) IMPLANT
SUT VICRYL 3-0 CR8 SH (SUTURE) IMPLANT
SUT VICRYL 4-0 PS2 18IN ABS (SUTURE) IMPLANT
SWAB COLLECTION DEVICE MRSA (MISCELLANEOUS) ×2 IMPLANT
SWAB CULTURE ESWAB REG 1ML (MISCELLANEOUS) ×2 IMPLANT
SYR BULB 3OZ (MISCELLANEOUS) ×2 IMPLANT
TOWEL OR 17X24 6PK STRL BLUE (TOWEL DISPOSABLE) ×4 IMPLANT
TUBE CONNECTING 20X1/4 (TUBING) ×2 IMPLANT
YANKAUER SUCT BULB TIP NO VENT (SUCTIONS) ×2 IMPLANT

## 2015-06-24 NOTE — Anesthesia Preprocedure Evaluation (Addendum)
Anesthesia Evaluation  Patient identified by MRN, date of birth, ID band Patient awake    Reviewed: Allergy & Precautions, H&P , NPO status , Patient's Chart, lab work & pertinent test results  History of Anesthesia Complications Negative for: history of anesthetic complications  Airway Mallampati: II   Neck ROM: full    Dental   Pulmonary former smoker,    breath sounds clear to auscultation       Cardiovascular negative cardio ROS   Rhythm:regular Rate:Normal     Neuro/Psych  Neuromuscular disease negative psych ROS   GI/Hepatic GERD  ,  Endo/Other    Renal/GU      Musculoskeletal   Abdominal   Peds  Hematology   Anesthesia Other Findings   Reproductive/Obstetrics                            Anesthesia Physical Anesthesia Plan  ASA: II  Anesthesia Plan: General   Post-op Pain Management:    Induction: Intravenous  Airway Management Planned: LMA  Additional Equipment:   Intra-op Plan:   Post-operative Plan:   Informed Consent: I have reviewed the patients History and Physical, chart, labs and discussed the procedure including the risks, benefits and alternatives for the proposed anesthesia with the patient or authorized representative who has indicated his/her understanding and acceptance.     Plan Discussed with: CRNA, Anesthesiologist and Surgeon  Anesthesia Plan Comments:         Anesthesia Quick Evaluation

## 2015-06-24 NOTE — Anesthesia Postprocedure Evaluation (Signed)
Anesthesia Post Note  Patient: Katina DegreeJoseph Luberto  Procedure(s) Performed: Procedure(s) (LRB): INCISION AND DRAINAGE LEFT PECTORALIS MUSCLE (Left)  Patient location during evaluation: PACU Anesthesia Type: General Level of consciousness: awake and alert and patient cooperative Pain management: pain level controlled Vital Signs Assessment: post-procedure vital signs reviewed and stable Respiratory status: spontaneous breathing and respiratory function stable Cardiovascular status: stable Anesthetic complications: no    Last Vitals:  Filed Vitals:   06/24/15 1230 06/24/15 1345  BP: 147/68 146/67  Pulse: 67 70  Temp: 37.3 C 36.9 C  Resp: 16 16    Last Pain:  Filed Vitals:   06/24/15 1350  PainSc: 2                  Eugene Isadore S

## 2015-06-24 NOTE — H&P (Signed)
PREOPERATIVE H&P  Chief Complaint: Draining left pectoralis repair wound  HPI: Jerry Carey is a 44 y.o. male had a left pectoralis repair done about 2 weeks ago with Dr. Thurston HoleWainer. He denies pain or fevers or chills but has had persistent drainage that has been going on for the last week or so. It recently has turned somewhat purulent. He was instructed to take antibiotics, which he ended up not doing. Pain is reported as minimal, but drainage has been increasing, worse with movement of the arm which she has tried not to do.  Past Medical History  Diagnosis Date  . GERD (gastroesophageal reflux disease)   . Pectoralis muscle rupture     left   Past Surgical History  Procedure Laterality Date  . Wisdom tooth extraction    . Tonsillectomy    . Pectoralis tendon repair Left 06/03/2015    Procedure: LEFT PECTORALIS TENDON REPAIR;  Surgeon: Salvatore Marvelobert Wainer, MD;  Location: [facility];  Service: Orthopedics;  Laterality: Left;   Social History   Social History  . Marital Status: Divorced    Spouse Name: N/A  . Number of Children: N/A  . Years of Education: N/A   Social History Main Topics  . Smoking status: Former Smoker    Types: Cigarettes    Quit date: 11/28/2008  . Smokeless tobacco: None  . Alcohol Use: Yes     Comment: social  . Drug Use: No  . Sexual Activity: Not Asked   Other Topics Concern  . None   Social History Narrative   History reviewed. No pertinent family history. Allergies  Allergen Reactions  . Chlorhexidine Gluconate Rash    Patient states he had severe burning and rash and was given benadryl   Prior to Admission medications   Medication Sig Start Date End Date Taking? Authorizing Provider  cyclobenzaprine (FLEXERIL) 5 MG tablet Take 1 tablet (5 mg total) by mouth 3 (three) times daily as needed for muscle spasms. 06/03/15  Yes Cristie HemMary L Stanbery, PA-C  loratadine (CLARITIN) 10 MG tablet Take 10 mg by mouth daily as needed for allergies.     Yes Historical Provider, MD  Multiple Vitamin (MULTIVITAMIN WITH MINERALS) TABS tablet Take 1 tablet by mouth daily.   Yes Historical Provider, MD  naproxen (NAPROSYN) 500 MG tablet Take 500 mg by mouth 2 (two) times daily as needed for moderate pain.   Yes Historical Provider, MD  oxyCODONE (ROXICODONE) 5 MG immediate release tablet Take 1-2 tabs po q4-6 hours prn pain Patient taking differently: Take 5-10 mg by mouth every 4 (four) hours as needed for severe pain. Take 1-2 tabs po q4-6 hours prn pain 06/03/15  Yes Cristie HemMary L Stanbery, PA-C  oxymetazoline (AFRIN) 0.05 % nasal spray Place 2 sprays into both nostrils 2 (two) times daily as needed for congestion.   Yes Historical Provider, MD  ranitidine (ZANTAC) 150 MG tablet Take 150 mg by mouth 2 (two) times daily as needed for heartburn.    Yes Historical Provider, MD  cephALEXin (KEFLEX) 500 MG capsule Take 1 capsule (500 mg total) by mouth 3 (three) times daily. 06/18/15   Richardean Canalavid H Yao, MD     Positive ROS: All other systems have been reviewed and were otherwise negative with the exception of those mentioned in the HPI and as above.  Physical Exam: General: Alert, no acute distress Cardiovascular: No pedal edema Respiratory: No cyanosis, no use of accessory musculature GI: No organomegaly, abdomen is soft and non-tender Skin: No  lesions in the area of chief complaint Neurologic: Sensation intact distally Psychiatric: Patient is competent for consent with normal mood and affect Lymphatic: No axillary or cervical lymphadenopathy  MUSCULOSKELETAL:  Left pectoralis incision is open and draining at the distal edge with some seropurulent type material.  Assessment: Probable left pectoralis postoperative wound infection   Plan: Plan for Procedure(s): INCISION AND DRAINAGE LEFT PECTORALIS MUSCLE  The risks benefits and alternatives were discussed with the patient including but not limited to the risks of nonoperative treatment, versus surgical  intervention including infection, bleeding, nerve injury,  blood clots, cardiopulmonary complications, morbidity, mortality, among others, and they were willing to proceed. We have also discussed the potential for progression of infection, the need for multiple revision washouts, the potential need for antibiotics either orally or intravenously.  Eulas Post, MD Cell [phone]   06/24/2015 7:32 AM

## 2015-06-24 NOTE — Transfer of Care (Signed)
Immediate Anesthesia Transfer of Care Note  Patient: Jerry Carey  Procedure(s) Performed: Procedure(s): INCISION AND DRAINAGE LEFT PECTORALIS MUSCLE (Left)  Patient Location: PACU  Anesthesia Type:General  Level of Consciousness: sedated  Airway & Oxygen Therapy: Patient Spontanous Breathing and Patient connected to face mask oxygen  Post-op Assessment: Report given to RN and Post -op Vital signs reviewed and stable  Post vital signs: Reviewed and stable  Last Vitals:  Filed Vitals:   06/24/15 0841 06/24/15 0842  BP:    Pulse: 74 75  Temp:    Resp:  30    Complications: No apparent anesthesia complications

## 2015-06-24 NOTE — Anesthesia Procedure Notes (Signed)
Procedure Name: LMA Insertion Date/Time: 06/24/2015 7:42 AM Performed by: Burna CashONRAD, Isley C Pre-anesthesia Checklist: Patient identified, Emergency Drugs available, Suction available and Patient being monitored Patient Re-evaluated:Patient Re-evaluated prior to inductionOxygen Delivery Method: Circle System Utilized Preoxygenation: Pre-oxygenation with 100% oxygen Intubation Type: IV induction Ventilation: Mask ventilation without difficulty LMA: LMA inserted LMA Size: 5.0 Number of attempts: 1 Airway Equipment and Method: Bite block Placement Confirmation: positive ETCO2 Tube secured with: Tape Dental Injury: Teeth and Oropharynx as per pre-operative assessment

## 2015-06-24 NOTE — Op Note (Signed)
06/24/2015  8:35 AM  PATIENT:  Jerry Carey    PRE-OPERATIVE DIAGNOSIS:  Left shoulder postoperative hematoma with drainage after pectoralis repair, possible infection  POST-OPERATIVE DIAGNOSIS:  Left shoulder infected hematoma, status post pectoralis repair  PROCEDURE:  Incision, irrigation, drainage, left shoulder abscess over a pectoralis repair with removal of fiber tape foreign material  SURGEON:  Eulas PostLANDAU,Paula Busenbark P, MD  PHYSICIAN ASSISTANT: Janace LittenBrandon Parry, OPA-C, present and scrubbed throughout the case, critical for completion in a timely fashion, and for retraction, instrumentation, and closure.  ANESTHESIA:   General  PREOPERATIVE INDICATIONS:  Jerry Carey is a  44 y.o. male who had a left pectoralis repair done a couple of weeks ago by Dr. Thurston HoleWainer. He presented with recurrent swelling, drainage, and in fact attempted to release some of the drainage using a "sterile hypodermic needle" on his own. There was a small "bubble" formed over the distal portion of the wound, that had separated with wound dehiscence. He was not having fevers or chills. He was instructed to take Keflex, although did not. He also was instructed to use a sling, although did not.  The risks benefits and alternatives were discussed with the patient preoperatively including but not limited to the risks of infection, bleeding, nerve injury, cardiopulmonary complications, the need for revision surgery, among others, and the patient was willing to proceed. We also discussed the risks for recurrent disruption of the repair, the potential need for multiple revision surgeries, progression of potential infection, cosmetic deformity, among others.  OPERATIVE IMPLANTS: Quarter inch Penrose drain  OPERATIVE FINDINGS: There was significant abscess formation throughout the left incision, with about 75 mL seropurulent hematoma. The upper fiber tape was disrupted, and loose. The lower anchor appeared to be  intact.  OPERATIVE PROCEDURE: The patient was brought to the operating room and placed in the supine position. Gen. anesthesia was administered. IV Ancef was given. The left upper extremity was prepped and draped in usual sterile fashion. He was in the supine position. We took care not to externally rotate the arm, and minimally adjust the shoulder during prepping and draping in order to minimize risk of disruption of the repair.  Timeout was performed and incision was made through his previous incision. A large hematoma was expressed that was seropurulent. This was sent to the laboratory for Gram stain culture and sensitivity both with swab as well as with tissue.  We then gave him a gram of vancomycin intravenously.  I then evacuated the hematoma completely, explored the wound, cleaned all of the tissue using a Cobb and a gauze. I removed all of the Vicryl sutures in the subcutaneous tissue. I did not use a scalpel deep, and did more of an abrasion with the Cobb and the gauze. After complete debridement was carried out, there was a loose piece of FiberWire which I removed sharply with a knife.  I irrigated a total of 6 L of fluid through the wound, and I excised a portion of what was the dehisced wound, and sinus tract, at the skin level trying to take care to optimize cosmesis. I placed a distal Penrose drain, and repaired the skin with nylon suture. This was necessary in order to minimize the risk for retained foreign body within the wound that could potentially potentiate the infection.  He was dressed with sterile gauze, and I have communicated to Dr. Thurston HoleWainer and Julien GirtKirstin Shepperson, PA-C regarding his status, and they are going to be organizing their preferred disposition regarding antibiotics and subsequent treatment.

## 2015-06-25 MED ORDER — ACETAMINOPHEN 325 MG PO TABS: 650.0000 mg | ORAL_TABLET | Freq: Four times a day (QID) | ORAL | Status: DC | PRN

## 2015-06-25 MED ORDER — POLYETHYLENE GLYCOL 3350 17 GM/SCOOP PO POWD: ORAL | Status: DC

## 2015-06-25 MED ORDER — VANCOMYCIN HCL IN DEXTROSE 1-5 GM/200ML-% IV SOLN
1000.0000 mg | Freq: Two times a day (BID) | INTRAVENOUS | Status: AC
  Administered 2015-06-25: 1000 mg via INTRAVENOUS

## 2015-06-25 MED ORDER — DOXYCYCLINE HYCLATE 100 MG PO TABS: 100.0000 mg | ORAL_TABLET | Freq: Two times a day (BID) | ORAL | Status: DC

## 2015-06-25 MED ORDER — SACCHAROMYCES BOULARDII 250 MG PO CAPS: 250.0000 mg | ORAL_CAPSULE | Freq: Two times a day (BID) | ORAL | Status: DC

## 2015-06-25 MED ORDER — VANCOMYCIN HCL IN DEXTROSE 1-5 GM/200ML-% IV SOLN
INTRAVENOUS | Status: AC
  Filled 2015-06-25: qty 200

## 2015-06-25 MED ORDER — ALPRAZOLAM 0.5 MG PO TABS: 0.5000 mg | ORAL_TABLET | Freq: Three times a day (TID) | ORAL | Status: DC | PRN

## 2015-06-25 MED ORDER — LEVOFLOXACIN 750 MG PO TABS: 750.0000 mg | ORAL_TABLET | Freq: Every day | ORAL | Status: DC

## 2015-06-25 MED ORDER — BSS IO SOLN
INTRAOCULAR | Status: AC
  Filled 2015-06-25: qty 15

## 2015-06-25 NOTE — Discharge Summary (Signed)
Patient ID: Jerry Carey MRN: [id number] DOB/AGE: [birth year] 44 y.o.  Admit date: 06/24/2015 Discharge date: 06/25/2015  Admission Diagnoses:  Principal Problem:   Abscess of left shoulder Active Problems:   Wound infection following procedure   Discharge Diagnoses:  Same  Past Medical History  Diagnosis Date  . GERD (gastroesophageal reflux disease)   . Pectoralis muscle rupture     left  . Abscess of left shoulder 06/24/2015    Surgeries: Procedure(s): INCISION AND DRAINAGE LEFT PECTORALIS MUSCLE on 06/24/2015   Consultants:    Discharged Condition: Improved  Hospital Course: Jerry Carey is an 44 y.o. male who was admitted 06/24/2015 for operative treatment ofAbscess of left shoulder. Patient has severe unremitting pain that affects sleep, daily activities, and work/hobbies. After pre-op clearance the patient was taken to the operating room on 06/24/2015 and underwent  Procedure(s): INCISION AND DRAINAGE LEFT PECTORALIS MUSCLE.    Patient was given perioperative antibiotics: Anti-infectives    Start     Dose/Rate Route Frequency Ordered Stop   06/25/15 0000  levofloxacin (LEVAQUIN) 750 MG tablet     750 mg Oral Daily 06/25/15 0752     06/25/15 0000  doxycycline (VIBRA-TABS) 100 MG tablet     100 mg Oral 2 times daily 06/25/15 0752     06/24/15 2100  vancomycin (VANCOCIN) IVPB 1000 mg/200 mL premix     1,000 mg 200 mL/hr over 60 Minutes Intravenous Every 12 hours 06/24/15 2035 06/24/15 2207   06/24/15 1200  ceFAZolin (ANCEF) IVPB 2 g/50 mL premix     2 g 100 mL/hr over 30 Minutes Intravenous 4 times per day 06/24/15 1103 06/25/15 0740   06/24/15 1115  vancomycin (VANCOCIN) IVPB 1000 mg/200 mL premix     1,000 mg 200 mL/hr over 60 Minutes Intravenous Every 12 hours 06/24/15 1103 06/24/15 0903   06/24/15 0648  ceFAZolin (ANCEF) IVPB 2 g/50 mL premix     2 g 100 mL/hr over 30 Minutes Intravenous On call to O.R. 06/24/15 1914 06/24/15 0752        Patient was given sequential compression devices, early ambulation, and chemoprophylaxis to prevent DVT.  Patient was given 24 hrs of IV antibiotics for his infection  Patient benefited maximally from hospital stay and there were no complications.    Recent vital signs: Patient Vitals for the past 24 hrs:  BP Temp Pulse Resp SpO2  06/25/15 0700 - - 68 16 96 %  06/25/15 0657 138/69 mmHg 97.7 F (36.5 C) 67 16 96 %  06/25/15 0600 - - 61 16 94 %  06/25/15 0500 - - 64 16 94 %  06/25/15 0330 - - 60 16 95 %  06/25/15 0300 - - 68 16 100 %  06/25/15 0200 - - 71 16 93 %  06/25/15 0050 - - 76 16 94 %  06/24/15 2300 (!) 155/74 mmHg 97.8 F (36.6 C) 73 16 97 %  06/24/15 2200 - - 82 16 96 %  06/24/15 2125 - - - - 91 %  06/24/15 2100 - - 80 16 96 %  06/24/15 2045 - - 82 - 96 %  06/24/15 1915 (!) 149/80 mmHg 98.9 F (37.2 C) 79 16 97 %  06/24/15 1700 - - - 16 -  06/24/15 1624 - - - 16 -  06/24/15 1445 125/60 mmHg 98.8 F (37.1 C) 77 16 98 %  06/24/15 1345 (!) 146/67 mmHg 98.5 F (36.9 C) 70 16 96 %  06/24/15 1230 (!) 147/68 mmHg 99.2 F (  37.3 C) 67 16 97 %  06/24/15 1120 129/63 mmHg 98.9 F (37.2 C) 67 16 99 %  06/24/15 1100 - 97.9 F (36.6 C) - 16 99 %  06/24/15 1030 111/61 mmHg - 65 13 99 %  06/24/15 1015 (!) 97/56 mmHg - 60 10 95 %  06/24/15 1000 (!) 94/57 mmHg - 76 20 96 %  06/24/15 0945 (!) 99/54 mmHg - 67 14 96 %  06/24/15 0930 (!) 102/37 mmHg - 66 11 99 %  06/24/15 0915 (!) 100/46 mmHg - 69 20 98 %  06/24/15 0900 (!) 91/35 mmHg - 72 19 97 %  06/24/15 0845 (!) 87/41 mmHg 98.3 F (36.8 C) 75 18 96 %  06/24/15 0842 - - 75 (!) 30 95 %  06/24/15 0841 - - 74 - 94 %     Recent laboratory studies: No results for input(s): WBC, HGB, HCT, PLT, NA, K, CL, CO2, BUN, CREATININE, GLUCOSE, INR, CALCIUM in the last 72 hours.  Invalid input(s): PT, 2   Discharge Medications:     Medication List    STOP taking these medications        cephALEXin 500 MG capsule  Commonly known  as:  KEFLEX     naproxen 500 MG tablet  Commonly known as:  NAPROSYN     oxyCODONE 5 MG immediate release tablet  Commonly known as:  ROXICODONE      TAKE these medications        acetaminophen 325 MG tablet  Commonly known as:  TYLENOL  Take 2 tablets (650 mg total) by mouth every 6 (six) hours as needed for mild pain (or Fever >/= 101).     ALPRAZolam 0.5 MG tablet  Commonly known as:  XANAX  Take 1-2 tablets (0.5-1 mg total) by mouth 3 (three) times daily as needed for anxiety or sleep.     cyclobenzaprine 5 MG tablet  Commonly known as:  FLEXERIL  Take 1 tablet (5 mg total) by mouth 3 (three) times daily as needed for muscle spasms.     doxycycline 100 MG tablet  Commonly known as:  VIBRA-TABS  Take 1 tablet (100 mg total) by mouth 2 (two) times daily.     levofloxacin 750 MG tablet  Commonly known as:  LEVAQUIN  Take 1 tablet (750 mg total) by mouth daily.     loratadine 10 MG tablet  Commonly known as:  CLARITIN  Take 10 mg by mouth daily as needed for allergies.     multivitamin with minerals Tabs tablet  Take 1 tablet by mouth daily.     oxyCODONE-acetaminophen 5-325 MG tablet  Commonly known as:  ROXICET  Take 1-2 tablets by mouth every 6 (six) hours as needed for severe pain.     oxymetazoline 0.05 % nasal spray  Commonly known as:  AFRIN  Place 2 sprays into both nostrils 2 (two) times daily as needed for congestion.     polyethylene glycol powder powder  Commonly known as:  GLYCOLAX/MIRALAX  17grams in 16 oz of water twice a day until bowel movement.  LAXITIVE.  Restart if two days since last bowel movement     ranitidine 150 MG tablet  Commonly known as:  ZANTAC  Take 150 mg by mouth 2 (two) times daily as needed for heartburn.     saccharomyces boulardii 250 MG capsule  Commonly known as:  FLORASTOR  Take 1 capsule (250 mg total) by mouth 2 (two) times daily.  Diagnostic Studies: Mr Shoulder Left Wo Contrast  05/29/2015  CLINICAL DATA:   Left shoulder injury while lifting weights. Known pectoralis injury. EXAM: MRI OF THE LEFT SHOULDER WITHOUT CONTRAST TECHNIQUE: Multiplanar, multisequence MR imaging of the shoulder was performed. No intravenous contrast was administered. COMPARISON:  Radiographs 05/25/2015 FINDINGS: Rotator cuff: Mild rotator cuff tendinopathy/ tendinosis. No partial or full thickness rotator cuff tear. Muscles: The rotator cuff muscles are normal. Extensive edema like signal abnormality in the visualized portion of the pectoralis major muscle. Suspect complete rupture of the pectoralis tendon. Dedicated pectoralis study suggested for further evaluation. There is a small tear involving the lateral deltoid muscle. Biceps long head: Intact. Moderate fluid around the biceps tendon along with a small calcification. Acromioclavicular Joint: Intact. Mild degenerative changes. The acromion is type 2 in shape. Minimal lateral downsloping. No undersurface spurring. Glenohumeral Joint: No significant degenerative changes or joint effusion. Mild synovitis. Labrum: Degenerative fraying type changes involving the superior and anterior labrum. No obvious tear. Bones:  No acute bony findings. IMPRESSION: 1. Findings suspicious for a pectoralis tendon rupture. Dedicated pectoralis study would be helpful if clinically necessary. 2. Small lateral deltoid muscle tear. 3. Intact rotator cuff tendons and long head biceps tendon. 4. Superior and anterior labral degenerative changes without obvious tear. 5. No significant findings for bony impingement. Electronically Signed   By: Rudie MeyerP.  Gallerani M.D.   On: 05/29/2015 13:56    Disposition: 01-Home or Self Care        Follow-up Information    Follow up with Nilda SimmerWAINER,ROBERT A, MD On 06/28/2015.   Specialty:  Orthopedic Surgery   Why:  appt time 4 pm   Contact information:   [address] [place] [address] [phone]        Signed: Pascal LuxSHEPPERSON,Doralee Kocak J 06/25/2015,  7:55 AM

## 2015-06-25 NOTE — Discharge Instructions (Signed)
Diet: As you were doing prior to hospitalization   Shower:  OK to sponge bathe.  DO NOT GET DRESSING WET   Dressing:  Change the dressing daily, with clean dry gauze. If the gauze is saturated then change with fresh gauze.   Activity:  Increase activity slowly as tolerated, but follow the weight bearing instructions below.  The rules on driving is that you can not be taking narcotics while you drive, and you must feel in control of the vehicle.    Weight Bearing:   Sling at all times..    To prevent constipation: you may use a stool softener such as -  Colace (over the counter) 100 mg by mouth twice a day  Drink plenty of fluids (prune juice may be helpful) and high fiber foods Miralax (over the counter) for constipation twice a day with 16 ounces of water Florastor puts the good bacteria back in your gut since you are on high powered antibiotics that are killing the bacteria in you whole body  Itching:  If you experience itching with your medications, try taking only a single pain pill, or even half a pain pill at a time.  You may take up to 10 pain pills per day, and you can also use benadryl over the counter for itching or also to help with sleep.   Precautions:  If you experience chest pain or shortness of breath - call 911 immediately for transfer to the hospital emergency department!!  If you develop a fever greater that 101 F, purulent drainage from wound, increased redness or drainage from wound, or calf pain -- Call the office at [phone]                                                Follow- Up Appointment:  Please call for an appointment to be seen in 2 weeks [place] - [phone]

## 2015-06-25 NOTE — Addendum Note (Signed)
Addendum  created 06/25/15 1129 by Jewel Baizeimothy D Mckynleigh Mussell, CRNA   Modules edited: Charges VN

## 2015-06-26 LAB — WOUND CULTURE: Culture: NO GROWTH

## 2015-06-27 LAB — TISSUE CULTURE: CULTURE: NO GROWTH

## 2015-06-29 ENCOUNTER — Encounter (HOSPITAL_BASED_OUTPATIENT_CLINIC_OR_DEPARTMENT_OTHER): Payer: Self-pay | Admitting: Orthopedic Surgery

## 2015-06-29 LAB — ANAEROBIC CULTURE

## 2015-07-01 ENCOUNTER — Encounter (HOSPITAL_BASED_OUTPATIENT_CLINIC_OR_DEPARTMENT_OTHER): Payer: Self-pay | Admitting: Orthopedic Surgery

## 2015-07-01 NOTE — Addendum Note (Signed)
Addendum  created 07/01/15 [id number] by Karie SchwalbeMary Ineze Serrao, MD   Modules edited: Anesthesia Events

## 2015-07-12 ENCOUNTER — Encounter: Payer: Self-pay | Admitting: Physician Assistant

## 2015-07-12 ENCOUNTER — Other Ambulatory Visit: Payer: Self-pay | Admitting: Physician Assistant

## 2015-07-12 DIAGNOSIS — B957 Other staphylococcus as the cause of diseases classified elsewhere: Secondary | ICD-10-CM

## 2015-07-12 HISTORY — DX: Other staphylococcus as the cause of diseases classified elsewhere: B95.7

## 2015-07-12 NOTE — H&P (Signed)
Jerry Carey is an 45 y.o. male.   Chief Complaint: left shoulder wound draining HPI: This patient  Tore his left pec and had repair on 06/03/15   Wound dehissed on 06/24/2015 underwent I and D.   Wound started draining last Thursday.  Tried to admit patient on 07/12/2015 for treatment of his staph infection  Patient refused.  He stated he would agree to admission on Thursday.  Past Medical History  Diagnosis Date  . GERD (gastroesophageal reflux disease)   . Pectoralis muscle rupture     left  . Abscess of left shoulder 06/24/2015  . Coagulase-negative staphylococcal infection 07/12/2015    left shoulder wound    Past Surgical History  Procedure Laterality Date  . Wisdom tooth extraction    . Tonsillectomy    . Incision and drainage Left 06/24/2015    Procedure: INCISION AND DRAINAGE LEFT PECTORALIS MUSCLE;  Surgeon: Teryl LucyJoshua Landau, MD;  Location: [facility];  Service: Orthopedics;  Laterality: Left;  . Pectoralis tendon repair Left 06/03/2015    Procedure: LEFT PECTORALIS TENDON REPAIR;  Surgeon: Salvatore Marvelobert Wainer, MD;  Location: [facility];  Service: Orthopedics;  Laterality: Left;    No family history on file. Social History:  reports that he quit smoking about 6 years ago. His smoking use included Cigarettes. He does not have any smokeless tobacco history on file. He reports that he drinks alcohol. He reports that he does not use illicit drugs.  Allergies:  Allergies  Allergen Reactions  . Chlorhexidine Gluconate Rash    Patient states he had severe burning and rash and was given benadryl     (Not in a hospital admission)  No results found for this or any previous visit (from the past 48 hour(s)). No results found.  Review of Systems  Constitutional: Negative.   HENT: Negative.   Eyes: Negative.   Cardiovascular: Negative.   Gastrointestinal: Negative.   Genitourinary: Negative.   Musculoskeletal:       Left shoulder wound draining  purulent material  Skin: Negative.   Neurological: Negative.   Endo/Heme/Allergies: Negative.   Psychiatric/Behavioral: Negative.     Blood pressure 135/79, pulse 66, temperature 98.1 F (36.7 C), height 6\' 1"  (1.854 m), weight 92.08 kg (203 lb), SpO2 99 %. Physical Exam  Constitutional: He is oriented to person, place, and time. He appears well-developed and well-nourished.  HENT:  Head: Normocephalic and atraumatic.  Mouth/Throat: Oropharynx is clear and moist.  Eyes: Conjunctivae are normal. Pupils are equal, round, and reactive to light.  Neck: Neck supple.  Cardiovascular: Normal rate and regular rhythm.   Respiratory: Effort normal.  GI: Soft.  Genitourinary:  Not pertinent to current symptomatology therefore not examined.  Musculoskeletal:  Left shoulder wound draining purulent material. 1 cm opening.  2+radial pulse.  Neurological: He is alert and oriented to person, place, and time.  Skin:  Open wound draining     Assessment Patient Active Problem List   Diagnosis Date Noted  . Coagulase-negative staphylococcal infection 07/12/2015  . Abscess of left shoulder 06/24/2015  . Wound infection following procedure 06/24/2015  . GERD (gastroesophageal reflux disease)   . Pectoralis muscle rupture     Plan I and D left shoulder   PICC line for IV antibiotics.  Casy Tavano J 07/12/2015, 5:51 PM

## 2015-07-14 ENCOUNTER — Encounter (HOSPITAL_COMMUNITY)
Admission: RE | Admit: 2015-07-14 | Discharge: 2015-07-14 | Disposition: A | Discharge status: Home / Self Care | Payer: Self-pay | Source: Ambulatory Visit | Attending: Orthopedic Surgery | Admitting: Orthopedic Surgery

## 2015-07-14 ENCOUNTER — Encounter (HOSPITAL_COMMUNITY): Payer: Self-pay

## 2015-07-14 DIAGNOSIS — Y838 Other surgical procedures as the cause of abnormal reaction of the patient, or of later complication, without mention of misadventure at the time of the procedure: Secondary | ICD-10-CM | POA: Insufficient documentation

## 2015-07-14 DIAGNOSIS — I498 Other specified cardiac arrhythmias: Secondary | ICD-10-CM | POA: Insufficient documentation

## 2015-07-14 DIAGNOSIS — L089 Local infection of the skin and subcutaneous tissue, unspecified: Secondary | ICD-10-CM | POA: Insufficient documentation

## 2015-07-14 DIAGNOSIS — Z0183 Encounter for blood typing: Secondary | ICD-10-CM | POA: Insufficient documentation

## 2015-07-14 DIAGNOSIS — T814XXA Infection following a procedure, initial encounter: Secondary | ICD-10-CM | POA: Insufficient documentation

## 2015-07-14 DIAGNOSIS — Z01818 Encounter for other preprocedural examination: Secondary | ICD-10-CM | POA: Insufficient documentation

## 2015-07-14 DIAGNOSIS — Z01812 Encounter for preprocedural laboratory examination: Secondary | ICD-10-CM | POA: Insufficient documentation

## 2015-07-14 HISTORY — DX: Adverse effect of unspecified anesthetic, initial encounter: T41.45XA

## 2015-07-14 HISTORY — DX: Other complications of anesthesia, initial encounter: T88.59XA

## 2015-07-14 HISTORY — DX: Presence of spectacles and contact lenses: Z97.3

## 2015-07-14 HISTORY — DX: Unspecified infectious disease: B99.9

## 2015-07-14 HISTORY — DX: Pneumonia, unspecified organism: J18.9

## 2015-07-14 LAB — CBC WITH DIFFERENTIAL/PLATELET
BASOS ABS: 0 10*3/uL (ref 0.0–0.1)
BASOS PCT: 1 %
Eosinophils Absolute: 0.1 10*3/uL (ref 0.0–0.7)
Eosinophils Relative: 1 %
HEMATOCRIT: 48 % (ref 39.0–52.0)
Hemoglobin: 16.5 g/dL (ref 13.0–17.0)
LYMPHS PCT: 24 %
Lymphs Abs: 1.6 10*3/uL (ref 0.7–4.0)
MCH: 31.4 pg (ref 26.0–34.0)
MCHC: 34.4 g/dL (ref 30.0–36.0)
MCV: 91.3 fL (ref 78.0–100.0)
Monocytes Absolute: 0.7 10*3/uL (ref 0.1–1.0)
Monocytes Relative: 10 %
NEUTROS ABS: 4.4 10*3/uL (ref 1.7–7.7)
Neutrophils Relative %: 64 %
PLATELETS: 275 10*3/uL (ref 150–400)
RBC: 5.26 MIL/uL (ref 4.22–5.81)
RDW: 12.7 % (ref 11.5–15.5)
WBC: 6.9 10*3/uL (ref 4.0–10.5)

## 2015-07-14 LAB — COMPREHENSIVE METABOLIC PANEL
ALBUMIN: 4.1 g/dL (ref 3.5–5.0)
ALT: 59 U/L (ref 17–63)
AST: 48 U/L — AB (ref 15–41)
Alkaline Phosphatase: 58 U/L (ref 38–126)
Anion gap: 7 (ref 5–15)
BILIRUBIN TOTAL: 0.6 mg/dL (ref 0.3–1.2)
BUN: 17 mg/dL (ref 6–20)
CHLORIDE: 104 mmol/L (ref 101–111)
CO2: 26 mmol/L (ref 22–32)
CREATININE: 0.99 mg/dL (ref 0.61–1.24)
Calcium: 9.6 mg/dL (ref 8.9–10.3)
GFR calc Af Amer: >60 mL/min (ref >60)
GLUCOSE: 90 mg/dL (ref 65–99)
POTASSIUM: 4.3 mmol/L (ref 3.5–5.1)
Sodium: 137 mmol/L (ref 135–145)
Total Protein: 7 g/dL (ref 6.5–8.1)

## 2015-07-14 LAB — ABO/RH: ABO/RH(D): A POS

## 2015-07-14 LAB — PROTIME-INR
INR: 1 (ref 0.00–1.49)
PROTHROMBIN TIME: 13.4 s (ref 11.6–15.2)

## 2015-07-14 LAB — APTT: APTT: 27 s (ref 24–37)

## 2015-07-14 LAB — TYPE AND SCREEN
ABO/RH(D): A POS
ANTIBODY SCREEN: NEGATIVE

## 2015-07-14 NOTE — Progress Notes (Signed)
Pt denies SOB, chest pain, and being under the care of a cardiologist. Pt denies having a stress test, echo and cardiac cath. Pt denies having a chest x ray within the last year.  

## 2015-07-14 NOTE — Anesthesia Preprocedure Evaluation (Addendum)
Anesthesia Evaluation  Patient identified by MRN, date of birth, ID band Patient awake    Reviewed: Allergy & Precautions, NPO status , Patient's Chart, lab work & pertinent test results  History of Anesthesia Complications (+) Emergence Delirium and history of anesthetic complications  Airway Mallampati: II  TM Distance: >3 FB Neck ROM: Full    Dental  (+) Dental Advisory Given, Chipped   Pulmonary former smoker (quit [id number]),    breath sounds clear to auscultation       Cardiovascular (-) anginanegative cardio ROS   Rhythm:Regular Rate:Normal     Neuro/Psych negative neurological ROS     GI/Hepatic Neg liver ROS, GERD  Medicated and Controlled,  Endo/Other  negative endocrine ROS  Renal/GU negative Renal ROS     Musculoskeletal   Abdominal   Peds  Hematology negative hematology ROS (+)   Anesthesia Other Findings Anabolic steroid user  Reproductive/Obstetrics                           Anesthesia Physical Anesthesia Plan  ASA: II  Anesthesia Plan: General   Post-op Pain Management:    Induction: Intravenous  Airway Management Planned: LMA  Additional Equipment:   Intra-op Plan:   Post-operative Plan:   Informed Consent: I have reviewed the patients History and Physical, chart, labs and discussed the procedure including the risks, benefits and alternatives for the proposed anesthesia with the patient or authorized representative who has indicated his/her understanding and acceptance.   Dental advisory given  Plan Discussed with: CRNA and Surgeon  Anesthesia Plan Comments: (Plan routine monitors, GA- LMA OK)        Anesthesia Quick Evaluation

## 2015-07-14 NOTE — Pre-Procedure Instructions (Signed)
    Claris Guymon  07/14/2015      [id number] - [place], [place] - [address] AT CORNER OF [facility] [address] [place] [address] Phone: [phone] Fax: [phone]  [facility] [address] [place] - [address] [place] [address] Phone: [phone] Fax: [phone]    Your procedure is scheduled on Thursday, July 15, 2015  Report to [facility] Admitting at 5:30 A.M.  Call this number if you have problems the morning of surgery:  [phone]   Remember:  Do not eat food or drink liquids after midnight.  Take these medicines the morning of surgery with A SIP OF WATER : if needed:  acetaminophen (TYLENOL) for pain, ALPRAZolam Prudy Feeler) for anxiety, loratadine (CLARITIN) for allergies, oxyCODONE-acetaminophen (ROXICET) for pain, ranitidine (ZANTAC) for heartburn, oxymetazoline (AFRIN)  nasal spray for congestion Stop taking Aspirin, vitamins, fish oil and herbal medications. Do not take any NSAIDs ie: Ibuprofen, Advil, Naproxen, BC and Goody Powder or any medication containing Aspirin; stop now.   Do not wear jewelry, make-up or nail polish.  Do not wear lotions, powders, or perfumes.  You may not wear deodorant.  Do not shave 48 hours prior to surgery.  Men may shave face and neck.  Do not bring valuables to the hospital.  [facility] is not responsible for any belongings or valuables.  Contacts, dentures or bridgework may not be worn into surgery.  Leave your suitcase in the car.  After surgery it may be brought to your room.  For patients admitted to the hospital, discharge time will be determined by your treatment team.  Patients discharged the day of surgery will not be allowed to drive home.   Name and phone number of your driver:  Special instructions: Shower the night before surgery and the morning of surgery with Dial Soap  Please read over the following fact  sheets that you were given. Pain Booklet, Coughing and Deep Breathing, Blood Transfusion Information and Surgical Site Infection Prevention

## 2015-07-15 ENCOUNTER — Encounter (HOSPITAL_COMMUNITY)
Admission: RE | Disposition: A | Discharge status: Home / Self Care | Payer: Self-pay | Source: Ambulatory Visit | Attending: Orthopedic Surgery

## 2015-07-15 ENCOUNTER — Encounter (HOSPITAL_COMMUNITY): Payer: Self-pay | Admitting: *Deleted

## 2015-07-15 ENCOUNTER — Inpatient Hospital Stay (HOSPITAL_COMMUNITY): Payer: MEDICAID | Admitting: Anesthesiology

## 2015-07-15 ENCOUNTER — Inpatient Hospital Stay (HOSPITAL_COMMUNITY)
Admission: RE | Admit: 2015-07-15 | Discharge: 2015-07-22 | DRG: 902 | Disposition: A | Discharge status: Home / Self Care | Payer: Self-pay | Source: Ambulatory Visit | Attending: Orthopedic Surgery | Admitting: Orthopedic Surgery

## 2015-07-15 ENCOUNTER — Inpatient Hospital Stay (HOSPITAL_COMMUNITY): Payer: Self-pay | Admitting: Anesthesiology

## 2015-07-15 DIAGNOSIS — B957 Other staphylococcus as the cause of diseases classified elsewhere: Secondary | ICD-10-CM | POA: Diagnosis present

## 2015-07-15 DIAGNOSIS — T814XXA Infection following a procedure, initial encounter: Secondary | ICD-10-CM | POA: Diagnosis present

## 2015-07-15 DIAGNOSIS — Z87891 Personal history of nicotine dependence: Secondary | ICD-10-CM

## 2015-07-15 DIAGNOSIS — T8149XA Infection following a procedure, other surgical site, initial encounter: Secondary | ICD-10-CM | POA: Diagnosis present

## 2015-07-15 DIAGNOSIS — L02414 Cutaneous abscess of left upper limb: Secondary | ICD-10-CM | POA: Diagnosis present

## 2015-07-15 DIAGNOSIS — K219 Gastro-esophageal reflux disease without esophagitis: Secondary | ICD-10-CM | POA: Diagnosis present

## 2015-07-15 DIAGNOSIS — T8131XA Disruption of external operation (surgical) wound, not elsewhere classified, initial encounter: Principal | ICD-10-CM | POA: Diagnosis present

## 2015-07-15 DIAGNOSIS — S29011A Strain of muscle and tendon of front wall of thorax, initial encounter: Secondary | ICD-10-CM | POA: Diagnosis present

## 2015-07-15 HISTORY — PX: APPLICATION OF WOUND VAC: SHX5189

## 2015-07-15 HISTORY — PX: IRRIGATION AND DEBRIDEMENT ABSCESS: SHX5252

## 2015-07-15 HISTORY — PX: INCISION AND DRAINAGE ABSCESS: SHX5864

## 2015-07-15 LAB — URINE CULTURE: Culture: NO GROWTH

## 2015-07-15 SURGERY — INCISION AND DRAINAGE, ABSCESS
Anesthesia: General | Site: Shoulder | Laterality: Left

## 2015-07-15 MED ORDER — MIDAZOLAM HCL 2 MG/2ML IJ SOLN
0.5000 mg | Freq: Once | INTRAMUSCULAR | Status: AC | PRN
  Administered 2015-07-15: 2 mg via INTRAVENOUS

## 2015-07-15 MED ORDER — SUGAMMADEX SODIUM 200 MG/2ML IV SOLN
INTRAVENOUS | Status: AC
  Filled 2015-07-15: qty 2

## 2015-07-15 MED ORDER — ACETAMINOPHEN 325 MG PO TABS
650.0000 mg | ORAL_TABLET | Freq: Four times a day (QID) | ORAL | Status: DC | PRN
Start: 2015-07-15 — End: 2015-07-21

## 2015-07-15 MED ORDER — LORATADINE 10 MG PO TABS: 10.0000 mg | ORAL_TABLET | Freq: Every day | ORAL | Status: DC | PRN

## 2015-07-15 MED ORDER — ONDANSETRON HCL 4 MG/2ML IJ SOLN: 4.0000 mg | Freq: Four times a day (QID) | INTRAMUSCULAR | Status: DC | PRN

## 2015-07-15 MED ORDER — POLYETHYLENE GLYCOL 3350 17 G PO PACK
17.0000 g | PACK | Freq: Two times a day (BID) | ORAL | Status: DC
  Administered 2015-07-17 – 2015-07-20 (×6): 17 g via ORAL
  Filled 2015-07-15 (×10): qty 1

## 2015-07-15 MED ORDER — VANCOMYCIN HCL 10 G IV SOLR
1500.0000 mg | INTRAVENOUS | Status: AC
  Administered 2015-07-15: 1500 mg via INTRAVENOUS
  Filled 2015-07-15: qty 1500

## 2015-07-15 MED ORDER — POTASSIUM CHLORIDE IN NACL 20-0.9 MEQ/L-% IV SOLN
INTRAVENOUS | Status: DC
  Administered 2015-07-15 – 2015-07-17 (×4): via INTRAVENOUS
  Administered 2015-07-18: 100 mL/h via INTRAVENOUS
  Administered 2015-07-18: 16:00:00 via INTRAVENOUS
  Filled 2015-07-15 (×6): qty 1000

## 2015-07-15 MED ORDER — VANCOMYCIN HCL IN DEXTROSE 1-5 GM/200ML-% IV SOLN
1000.0000 mg | Freq: Three times a day (TID) | INTRAVENOUS | Status: DC
  Administered 2015-07-15 – 2015-07-17 (×6): 1000 mg via INTRAVENOUS
  Filled 2015-07-15 (×8): qty 200

## 2015-07-15 MED ORDER — BUPIVACAINE-EPINEPHRINE (PF) 0.5% -1:200000 IJ SOLN
INTRAMUSCULAR | Status: AC
  Filled 2015-07-15: qty 30

## 2015-07-15 MED ORDER — ROCURONIUM BROMIDE 100 MG/10ML IV SOLN
INTRAVENOUS | Status: DC | PRN
  Administered 2015-07-15: 40 mg via INTRAVENOUS

## 2015-07-15 MED ORDER — HYDROMORPHONE HCL 1 MG/ML IJ SOLN
0.2500 mg | INTRAMUSCULAR | Status: DC | PRN
  Administered 2015-07-15 (×4): 0.5 mg via INTRAVENOUS

## 2015-07-15 MED ORDER — PROPOFOL 10 MG/ML IV BOLUS
INTRAVENOUS | Status: DC | PRN
  Administered 2015-07-15: 200 mg via INTRAVENOUS

## 2015-07-15 MED ORDER — OXYCODONE HCL 5 MG PO TABS
ORAL_TABLET | ORAL | Status: AC
  Filled 2015-07-15: qty 2

## 2015-07-15 MED ORDER — MIDAZOLAM HCL 2 MG/2ML IJ SOLN
INTRAMUSCULAR | Status: AC
  Filled 2015-07-15: qty 2

## 2015-07-15 MED ORDER — FENTANYL CITRATE (PF) 100 MCG/2ML IJ SOLN
INTRAMUSCULAR | Status: DC | PRN
  Administered 2015-07-15: 100 ug via INTRAVENOUS
  Administered 2015-07-15: 50 ug via INTRAVENOUS
  Administered 2015-07-15: 100 ug via INTRAVENOUS

## 2015-07-15 MED ORDER — LACTATED RINGERS IV SOLN
INTRAVENOUS | Status: DC | PRN
  Administered 2015-07-15: 07:00:00 via INTRAVENOUS

## 2015-07-15 MED ORDER — MIDAZOLAM HCL 5 MG/5ML IJ SOLN
INTRAMUSCULAR | Status: DC | PRN
  Administered 2015-07-15: 2 mg via INTRAVENOUS

## 2015-07-15 MED ORDER — ALPRAZOLAM 0.5 MG PO TABS
0.5000 mg | ORAL_TABLET | Freq: Three times a day (TID) | ORAL | Status: DC | PRN
  Administered 2015-07-15 – 2015-07-16 (×5): 0.5 mg via ORAL
  Administered 2015-07-16: 1 mg via ORAL
  Administered 2015-07-16: 0.5 mg via ORAL
  Administered 2015-07-17 – 2015-07-21 (×9): 1 mg via ORAL
  Filled 2015-07-15 (×3): qty 2
  Filled 2015-07-15 (×2): qty 1
  Filled 2015-07-15 (×6): qty 2
  Filled 2015-07-15 (×2): qty 1
  Filled 2015-07-15: qty 2
  Filled 2015-07-15: qty 1

## 2015-07-15 MED ORDER — METOCLOPRAMIDE HCL 5 MG PO TABS: 5.0000 mg | ORAL_TABLET | Freq: Three times a day (TID) | ORAL | Status: DC | PRN

## 2015-07-15 MED ORDER — PANTOPRAZOLE SODIUM 40 MG PO TBEC
40.0000 mg | DELAYED_RELEASE_TABLET | Freq: Every day | ORAL | Status: DC
  Administered 2015-07-15 – 2015-07-20 (×5): 40 mg via ORAL
  Filled 2015-07-15 (×5): qty 1

## 2015-07-15 MED ORDER — VANCOMYCIN HCL IN DEXTROSE 1-5 GM/200ML-% IV SOLN
1000.0000 mg | Freq: Two times a day (BID) | INTRAVENOUS | Status: DC
  Filled 2015-07-15 (×3): qty 200

## 2015-07-15 MED ORDER — POVIDONE-IODINE 7.5 % EX SOLN
Freq: Once | CUTANEOUS | Status: DC
  Filled 2015-07-15: qty 118

## 2015-07-15 MED ORDER — PROPOFOL 10 MG/ML IV BOLUS
INTRAVENOUS | Status: AC
  Filled 2015-07-15: qty 20

## 2015-07-15 MED ORDER — FENTANYL CITRATE (PF) 250 MCG/5ML IJ SOLN
INTRAMUSCULAR | Status: AC
  Filled 2015-07-15: qty 5

## 2015-07-15 MED ORDER — LIDOCAINE HCL (CARDIAC) 20 MG/ML IV SOLN
INTRAVENOUS | Status: DC | PRN
  Administered 2015-07-15: 80 mg via INTRAVENOUS

## 2015-07-15 MED ORDER — METOCLOPRAMIDE HCL 5 MG/ML IJ SOLN: 5.0000 mg | Freq: Three times a day (TID) | INTRAMUSCULAR | Status: DC | PRN

## 2015-07-15 MED ORDER — FLUTICASONE PROPIONATE 50 MCG/ACT NA SUSP
2.0000 | Freq: Every day | NASAL | Status: DC
  Administered 2015-07-15 – 2015-07-20 (×5): 2 via NASAL
  Filled 2015-07-15: qty 16

## 2015-07-15 MED ORDER — DOCUSATE SODIUM 100 MG PO CAPS
100.0000 mg | ORAL_CAPSULE | Freq: Two times a day (BID) | ORAL | Status: DC
  Administered 2015-07-17 – 2015-07-20 (×4): 100 mg via ORAL
  Filled 2015-07-15 (×9): qty 1

## 2015-07-15 MED ORDER — HYDROMORPHONE HCL 1 MG/ML IJ SOLN
INTRAMUSCULAR | Status: AC
  Administered 2015-07-15: 0.5 mg via INTRAVENOUS
  Filled 2015-07-15: qty 1

## 2015-07-15 MED ORDER — ALPRAZOLAM 0.25 MG PO TABS
ORAL_TABLET | ORAL | Status: AC
  Administered 2015-07-15: 0.5 mg via ORAL
  Filled 2015-07-15: qty 2

## 2015-07-15 MED ORDER — LACTATED RINGERS IV SOLN: INTRAVENOUS | Status: DC

## 2015-07-15 MED ORDER — MEPERIDINE HCL 25 MG/ML IJ SOLN: 6.2500 mg | INTRAMUSCULAR | Status: DC | PRN

## 2015-07-15 MED ORDER — ACETAMINOPHEN 650 MG RE SUPP
650.0000 mg | Freq: Four times a day (QID) | RECTAL | Status: DC | PRN
Start: 2015-07-15 — End: 2015-07-21

## 2015-07-15 MED ORDER — CEFAZOLIN SODIUM-DEXTROSE 2-3 GM-% IV SOLR
2.0000 g | Freq: Three times a day (TID) | INTRAVENOUS | Status: DC
  Administered 2015-07-15 – 2015-07-17 (×7): 2 g via INTRAVENOUS
  Filled 2015-07-15 (×10): qty 50

## 2015-07-15 MED ORDER — CEFAZOLIN SODIUM-DEXTROSE 2-3 GM-% IV SOLR
INTRAVENOUS | Status: AC
  Administered 2015-07-15: 2 g via INTRAVENOUS
  Filled 2015-07-15: qty 50

## 2015-07-15 MED ORDER — OXYCODONE HCL 5 MG PO TABS
5.0000 mg | ORAL_TABLET | ORAL | Status: DC | PRN
  Administered 2015-07-15 – 2015-07-21 (×23): 10 mg via ORAL
  Filled 2015-07-15 (×23): qty 2

## 2015-07-15 MED ORDER — VANCOMYCIN HCL 10 G IV SOLR
1750.0000 mg | Freq: Three times a day (TID) | INTRAVENOUS | Status: DC
  Filled 2015-07-15 (×2): qty 1750

## 2015-07-15 MED ORDER — CYCLOBENZAPRINE HCL 10 MG PO TABS
ORAL_TABLET | ORAL | Status: AC
  Administered 2015-07-15: 10 mg via ORAL
  Filled 2015-07-15: qty 1

## 2015-07-15 MED ORDER — CEFAZOLIN SODIUM-DEXTROSE 2-3 GM-% IV SOLR: 2.0000 g | INTRAVENOUS | Status: DC

## 2015-07-15 MED ORDER — CYCLOBENZAPRINE HCL 10 MG PO TABS
10.0000 mg | ORAL_TABLET | Freq: Three times a day (TID) | ORAL | Status: DC | PRN
  Administered 2015-07-15 – 2015-07-22 (×5): 10 mg via ORAL
  Filled 2015-07-15 (×4): qty 1

## 2015-07-15 MED ORDER — SUGAMMADEX SODIUM 200 MG/2ML IV SOLN
INTRAVENOUS | Status: DC | PRN
  Administered 2015-07-15: 200 mg via INTRAVENOUS

## 2015-07-15 MED ORDER — HYDROMORPHONE HCL 1 MG/ML IJ SOLN
1.0000 mg | INTRAMUSCULAR | Status: DC | PRN
  Administered 2015-07-15 – 2015-07-19 (×8): 1 mg via INTRAVENOUS
  Filled 2015-07-15 (×9): qty 1

## 2015-07-15 MED ORDER — 0.9 % SODIUM CHLORIDE (POUR BTL) OPTIME
TOPICAL | Status: DC | PRN
  Administered 2015-07-15 (×5): 1000 mL

## 2015-07-15 MED ORDER — ONDANSETRON HCL 4 MG/2ML IJ SOLN
INTRAMUSCULAR | Status: DC | PRN
  Administered 2015-07-15: 4 mg via INTRAVENOUS

## 2015-07-15 MED ORDER — ONDANSETRON HCL 4 MG PO TABS: 4.0000 mg | ORAL_TABLET | Freq: Four times a day (QID) | ORAL | Status: DC | PRN

## 2015-07-15 MED ORDER — MIDAZOLAM HCL 2 MG/2ML IJ SOLN
INTRAMUSCULAR | Status: AC
  Administered 2015-07-15: 2 mg via INTRAVENOUS
  Filled 2015-07-15: qty 2

## 2015-07-15 MED ORDER — PROMETHAZINE HCL 25 MG/ML IJ SOLN: 6.2500 mg | INTRAMUSCULAR | Status: DC | PRN

## 2015-07-15 MED ORDER — ENOXAPARIN SODIUM 40 MG/0.4ML ~~LOC~~ SOLN
40.0000 mg | SUBCUTANEOUS | Status: DC
  Administered 2015-07-16 – 2015-07-17 (×2): 40 mg via SUBCUTANEOUS
  Filled 2015-07-15 (×2): qty 0.4

## 2015-07-15 SURGICAL SUPPLY — 40 items
CANISTER WOUND CARE 500ML ATS (WOUND CARE) ×2 IMPLANT
COVER SURGICAL LIGHT HANDLE (MISCELLANEOUS) ×2 IMPLANT
DRAPE IMP U-DRAPE 54X76 (DRAPES) ×2 IMPLANT
DRAPE INCISE IOBAN 66X45 STRL (DRAPES) ×2 IMPLANT
DRAPE U-SHAPE 47X51 STRL (DRAPES) ×2 IMPLANT
DRSG MEPITEL 4X7.2 (GAUZE/BANDAGES/DRESSINGS) ×2 IMPLANT
DRSG VAC ATS SM SENSATRAC (GAUZE/BANDAGES/DRESSINGS) ×2 IMPLANT
DRSG VERSA FOAM LRG 10X15 (GAUZE/BANDAGES/DRESSINGS) IMPLANT
DURAPREP 26ML APPLICATOR (WOUND CARE) IMPLANT
ELECT REM PT RETURN 9FT ADLT (ELECTROSURGICAL) ×2
ELECTRODE REM PT RTRN 9FT ADLT (ELECTROSURGICAL) ×1 IMPLANT
FACESHIELD STD STERILE (MASK) ×4 IMPLANT
GAUZE SPONGE 4X4 12PLY STRL (GAUZE/BANDAGES/DRESSINGS) ×2 IMPLANT
GLOVE BIO SURGEON STRL SZ7 (GLOVE) ×2 IMPLANT
GLOVE BIOGEL PI IND STRL 7.0 (GLOVE) ×1 IMPLANT
GLOVE BIOGEL PI IND STRL 7.5 (GLOVE) ×2 IMPLANT
GLOVE BIOGEL PI INDICATOR 7.0 (GLOVE) ×1
GLOVE BIOGEL PI INDICATOR 7.5 (GLOVE) ×2
GLOVE SS BIOGEL STRL SZ 7.5 (GLOVE) ×1 IMPLANT
GLOVE SUPERSENSE BIOGEL SZ 7.5 (GLOVE) ×1
GLOVE SURG SS PI 7.0 STRL IVOR (GLOVE) ×2 IMPLANT
GOWN STRL REUS W/ TWL LRG LVL3 (GOWN DISPOSABLE) ×2 IMPLANT
GOWN STRL REUS W/TWL LRG LVL3 (GOWN DISPOSABLE) ×2
KIT ROOM TURNOVER OR (KITS) ×2 IMPLANT
MANIFOLD NEPTUNE II (INSTRUMENTS) ×2 IMPLANT
NEEDLE HYPO 25GX1X1/2 BEV (NEEDLE) ×2 IMPLANT
NS IRRIG 1000ML POUR BTL (IV SOLUTION) ×2 IMPLANT
PACK SHOULDER (CUSTOM PROCEDURE TRAY) ×2 IMPLANT
PAD ARMBOARD 7.5X6 YLW CONV (MISCELLANEOUS) ×2 IMPLANT
SLING ARM FOAM STRAP LRG (SOFTGOODS) ×2 IMPLANT
SPONGE LAP 4X18 X RAY DECT (DISPOSABLE) ×2 IMPLANT
SUCTION FRAZIER TIP 10 FR DISP (SUCTIONS) ×2 IMPLANT
SUT ETHILON 2 0 FS 18 (SUTURE) ×2 IMPLANT
SUT ETHILON O TP 1 (SUTURE) ×2 IMPLANT
SYR BULB IRRIGATION 50ML (SYRINGE) ×2 IMPLANT
SYR CONTROL 10ML LL (SYRINGE) ×2 IMPLANT
TOWEL OR 17X24 6PK STRL BLUE (TOWEL DISPOSABLE) ×2 IMPLANT
TOWEL OR 17X26 10 PK STRL BLUE (TOWEL DISPOSABLE) ×2 IMPLANT
TUBE CONNECTING 12X1/4 (SUCTIONS) ×2 IMPLANT
WATER STERILE IRR 1000ML POUR (IV SOLUTION) ×2 IMPLANT

## 2015-07-15 NOTE — Anesthesia Procedure Notes (Signed)
Procedure Name: Intubation Date/Time: 07/15/2015 7:19 AM Performed by: Marena Chancy Pre-anesthesia Checklist: Patient identified, Timeout performed, Emergency Drugs available, Suction available and Patient being monitored Patient Re-evaluated:Patient Re-evaluated prior to inductionOxygen Delivery Method: Circle system utilized Preoxygenation: Pre-oxygenation with 100% oxygen Intubation Type: IV induction Ventilation: Mask ventilation without difficulty Laryngoscope Size: Miller and 3 Grade View: Grade II Tube type: Oral Tube size: 7.5 mm Number of attempts: 1 Placement Confirmation: ETT inserted through vocal cords under direct vision,  breath sounds checked- equal and bilateral and positive ETCO2 Tube secured with: Tape Dental Injury: Teeth and Oropharynx as per pre-operative assessment

## 2015-07-15 NOTE — Progress Notes (Signed)
Refused to take off his underwear.

## 2015-07-15 NOTE — Interval H&P Note (Signed)
History and Physical Interval Note:  07/15/2015 6:33 AM  Jerry Carey  has presented today for surgery, with the diagnosis of LEFT SHOULDER POST OP INFECTION  The various methods of treatment have been discussed with the patient and family. After consideration of risks, benefits and other options for treatment, the patient has consented to  Procedure(s): OPEN INCISION AND DRAINAGE LEFT SHOULDER  (Left) as a surgical intervention .  The patient's history has been reviewed, patient examined, no change in status, stable for surgery.  I have reviewed the patient's chart and labs.  Questions were answered to the patient's satisfaction.     Salvatore Marvel A

## 2015-07-15 NOTE — Anesthesia Postprocedure Evaluation (Signed)
Anesthesia Post Note  Patient: Jerry Carey  Procedure(s) Performed: Procedure(s) (LRB): OPEN INCISION AND DRAINAGE LEFT SHOULDER  (Left)  Patient location during evaluation: PACU Anesthesia Type: General Level of consciousness: awake and alert, patient cooperative and oriented Pain management: pain level controlled Vital Signs Assessment: post-procedure vital signs reviewed and stable Respiratory status: spontaneous breathing, nonlabored ventilation and respiratory function stable Cardiovascular status: blood pressure returned to baseline and stable Postop Assessment: no signs of nausea or vomiting Anesthetic complications: no    Last Vitals:  Filed Vitals:   07/15/15 1115 07/15/15 1156  BP: 124/77 125/95  Pulse: 74 68  Temp:  36.5 C  Resp:  17    Last Pain:  Filed Vitals:   07/15/15 1156  PainSc: Asleep                 Jesselee Poth,E. Euline Kimbler

## 2015-07-15 NOTE — Op Note (Unsigned)
NAMESAM, OVERBECK           ACCOUNT NO.:  [id number]  MEDICAL RECORD NO.:  [id number]  LOCATION:  5N27C                        FACILITY:  [facility]  PHYSICIAN:  Elana Alm. Thurston Hole, M.D. DATE OF BIRTH:  1970-10-10  DATE OF PROCEDURE:  07/15/2015 DATE OF DISCHARGE:                              OPERATIVE REPORT   PREOPERATIVE DIAGNOSIS:  Left shoulder wound dehiscence and infection, status post pectoralis major rupture repair.  POSTOPERATIVE DIAGNOSIS:  Left shoulder wound dehiscence and infection, status post pectoralis major rupture repair.  PROCEDURE:  Left shoulder irrigation and debridement, followed by wound VAC application.  SURGEON:  Elana Alm. Thurston Hole, M.D.  ASSISTANT:  Julien Girt, PA.  ANESTHESIA:  General.  OPERATIVE TIME:  1 hour.  COMPLICATIONS:  None.  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  He underwent primary repair 5 weeks ago.  Subsequently, 2 weeks postoperative, he had a wound dehiscence, required a surgical debridement.  He has since developed a recurrent infection and another wound dehiscence, is now to undergo a second irrigation and debridement and application of a wound VAC.  DESCRIPTION OF PROCEDURE:  Mr. Gater was brought to the operating room on July 15, 2015, placed on operative table in supine position. He received antibiotics preoperatively for prophylaxis.  After being placed under general anesthesia, his left shoulder was examined.  He had a wound dehiscence of his anterior axillary incision.  There was mild purulence in the wound.  No surrounding erythema.  Range of motion in the shoulder was 50%.  At this point, his shoulder and arm was prepped, using sterile Betadine and draped using sterile technique.  A time-out procedure was called and the correct left shoulder  identified.  The previously placed sutures were removed.  The wound was thoroughly inspected.  There was found to be mild purulence, but the underlying deep muscular tissue was intact.  At this point, 10 L of saline solution as well as chlorhexidine was thoroughly irrigated through the wound and deep into the tissues around the pectoralis major repair.  The repair itself did appear to be intact.  After a thorough debridement and irrigation, then the skin edges were sharply debrided back to bleeding tissue.  Dr. Myrene Galas of Orthopedic Trauma came into the operating room, during the procedure to give [place] further advice about wound closure versus wound VAC use.  He recommended using the wound VAC with only a partial wound closure, which we completely agreed with.  After this thorough irrigation and debridement had been carried out, then just the most proximal and distal ends of the wound were closed with a 2-0 nylon interrupted mattress everting sutures.  The central portion of the wound was then had a wound VAC placed with both deep wound VAC sponge followed by Mepitel,  followed by a superficial wound VAC sponge and application of the seal.  An excellent seal was achieved.  At this point, it is felt that all pathology had been satisfactorily addressed.  Sterile dressings were applied.  The wound VAC was hooked up and then the patient awakened and  taken to recovery in stable condition.  Needle and sponge counts correct x2 at the end of the case.     Cleven Jansma A. Thurston Hole, M.D.     RAW/MEDQ  D:  07/15/2015  T:  07/15/2015  Job:  [id number]

## 2015-07-15 NOTE — Progress Notes (Signed)
Utilization review completed.  

## 2015-07-15 NOTE — Progress Notes (Signed)
Pt transferred to 5 north. Settled in room. Tech  to get VS. Waiting for RN

## 2015-07-15 NOTE — Progress Notes (Signed)
07/15/15 1300 nursing  To unit 5N alert and oriented patient per bed accompanied by RN with ongoing PIV infusing well; sling on lt shoulder noted; wound vac intact . Oriented to unit set-up. Will continue to monitor.

## 2015-07-15 NOTE — Progress Notes (Addendum)
ANTIBIOTIC CONSULT NOTE - INITIAL  Pharmacy Consult:  Vancomycin Indication:  CoNS deep wound infection  Allergies  Allergen Reactions  . Chlorhexidine Gluconate Rash    Patient states he had severe burning and rash and was given benadryl    Patient Measurements: Height:  (185.4 cm) Weight: 204 lb (92.534 kg) IBW/kg (Calculated) : 79.9  Vital Signs: Temp: 98.2 F (36.8 C) (01/19 1000) Temp Source: Oral (01/19 0625) BP: 124/77 mmHg (01/19 1115) Pulse Rate: 74 (01/19 1115) Intake/Output from this shift: Total I/O In: 700 [I.V.:700] Out: -   Labs:  Recent Labs  07/14/15 1124  WBC 6.9  HGB 16.5  PLT 275  CREATININE 0.99   Estimated Creatinine Clearance: 104.2 mL/min (by C-G formula based on Cr of 0.99). No results for input(s): VANCOTROUGH, VANCOPEAK, VANCORANDOM, GENTTROUGH, GENTPEAK, GENTRANDOM, TOBRATROUGH, TOBRAPEAK, TOBRARND, AMIKACINPEAK, AMIKACINTROU, AMIKACIN in the last 72 hours.   Microbiology: Recent Results (from the past 720 hour(s))  Anaerobic culture     Status: None   Collection Time: 06/24/15  8:00 AM  Result Value Ref Range Status   Specimen Description TISSUE LEFT SHOULDER  Final   Special Requests NONE  Final   Gram Stain   Final    FEW WBC PRESENT, PREDOMINANTLY PMN NO ORGANISMS SEEN Performed at [facility]    Culture   Final    NO ANAEROBES ISOLATED Performed at [facility]    Report Status 06/29/2015 FINAL  Final  Tissue culture     Status: None   Collection Time: 06/24/15  8:00 AM  Result Value Ref Range Status   Specimen Description TISSUE LEFT SHOULDER  Final   Special Requests NONE  Final   Gram Stain   Final    FEW WBC PRESENT, PREDOMINANTLY PMN NO ORGANISMS SEEN Performed at [facility]    Culture   Final    NO GROWTH 3 DAYS Performed at [facility]    Report Status 06/27/2015 FINAL  Final  Anaerobic culture     Status: None   Collection Time: 06/24/15  8:14 AM  Result Value  Ref Range Status   Specimen Description WOUND LEFT SHOULDER  Final   Special Requests NONE  Final   Gram Stain   Final    MODERATE WBC PRESENT,BOTH PMN AND MONONUCLEAR NO SQUAMOUS EPITHELIAL CELLS SEEN NO ORGANISMS SEEN Performed at [facility]    Culture   Final    NO ANAEROBES ISOLATED Performed at [facility]    Report Status 06/29/2015 FINAL  Final  Wound culture     Status: None   Collection Time: 06/24/15  8:14 AM  Result Value Ref Range Status   Specimen Description WOUND LEFT SHOULDER  Final   Special Requests NONE  Final   Gram Stain   Final    MODERATE WBC PRESENT,BOTH PMN AND MONONUCLEAR NO SQUAMOUS EPITHELIAL CELLS SEEN NO ORGANISMS SEEN Performed at [facility]    Culture   Final    NO GROWTH 2 DAYS Performed at [facility]    Report Status 06/26/2015 FINAL  Final  Urine culture     Status: None   Collection Time: 07/14/15 11:24 AM  Result Value Ref Range Status   Specimen Description URINE, CLEAN CATCH  Final   Special Requests none  Final   Culture NO GROWTH 1 DAY  Final   Report Status 07/15/2015 FINAL  Final    Medical History: Past Medical History  Diagnosis Date  .  GERD (gastroesophageal reflux disease)   . Pectoralis muscle rupture     left  . Abscess of left shoulder 06/24/2015  . Coagulase-negative staphylococcal infection 07/12/2015    left shoulder wound  . Complication of anesthesia     woke up angry and aggitated  . Infection     left shoulder wound  . Pneumonia   . Wears glasses       Assessment: 57 YOM with history of left should abscess and left pectoris muscle tear s/p repair on 06/03/15.  His wound dehisced on 06/24/15 and underwent I&D.  He presented today with left should wound drainage that started on 07/08/15.  Pharmacy consulted to start vancomycin for CoNS deep wound infection.  Baseline labs reviewed.  Patient received vancomycin  IV x 1 earlier this AM.   Goal of Therapy:   Vancomycin trough level 15-20 mcg/ml given deep infxn   Plan:  - Vanc 1gm IV Q8H - Ancef 2gm IV Q8H per MD - Monitor renal fxn, micro data, vanc trough at [facility]   Jerry Carey D. Laney Potash, PharmD, BCPS Pager:  [phone] 07/15/2015, 12:03 PM

## 2015-07-15 NOTE — Progress Notes (Signed)
Report from Devota Pace, RN

## 2015-07-15 NOTE — Transfer of Care (Signed)
Immediate Anesthesia Transfer of Care Note  Patient: Jerry Carey  Procedure(s) Performed: Procedure(s): OPEN INCISION AND DRAINAGE LEFT SHOULDER  (Left)  Patient Location: PACU  Anesthesia Type:General  Level of Consciousness: awake, alert  and oriented  Airway & Oxygen Therapy: Patient Spontanous Breathing and Patient connected to nasal cannula oxygen  Post-op Assessment: Report given to RN and Post -op Vital signs reviewed and stable  Post vital signs: Reviewed and stable  Last Vitals:  Filed Vitals:   07/15/15 0625  BP: 141/79  Pulse: 81  Temp: 36.4 C  Resp: 20    Complications: No apparent anesthesia complications

## 2015-07-15 NOTE — Progress Notes (Signed)
07/15/15 1516 nursing Patient was not happy for drawing another lab work at this time; MD notified new orders noted. Lab notified to get all labs in the morning.

## 2015-07-15 NOTE — H&P (View-Only) (Signed)
Jerry Carey is an 45 y.o. male.   Chief Complaint: left shoulder wound draining HPI: This patient  Tore his left pec and had repair on 06/03/15   Wound dehissed on 06/24/2015 underwent I and D.   Wound started draining last Thursday.  Tried to admit patient on 07/12/2015 for treatment of his staph infection  Patient refused.  He stated he would agree to admission on Thursday.  Past Medical History  Diagnosis Date  . GERD (gastroesophageal reflux disease)   . Pectoralis muscle rupture     left  . Abscess of left shoulder 06/24/2015  . Coagulase-negative staphylococcal infection 07/12/2015    left shoulder wound    Past Surgical History  Procedure Laterality Date  . Wisdom tooth extraction    . Tonsillectomy    . Incision and drainage Left 06/24/2015    Procedure: INCISION AND DRAINAGE LEFT PECTORALIS MUSCLE;  Surgeon: Teryl Lucy, MD;  Location: [facility];  Service: Orthopedics;  Laterality: Left;  . Pectoralis tendon repair Left 06/03/2015    Procedure: LEFT PECTORALIS TENDON REPAIR;  Surgeon: Salvatore Marvel, MD;  Location: [facility];  Service: Orthopedics;  Laterality: Left;    No family history on file. Social History:  reports that he quit smoking about 6 years ago. His smoking use included Cigarettes. He does not have any smokeless tobacco history on file. He reports that he drinks alcohol. He reports that he does not use illicit drugs.  Allergies:  Allergies  Allergen Reactions  . Chlorhexidine Gluconate Rash    Patient states he had severe burning and rash and was given benadryl     (Not in a hospital admission)  No results found for this or any previous visit (from the past 48 hour(s)). No results found.  Review of Systems  Constitutional: Negative.   HENT: Negative.   Eyes: Negative.   Cardiovascular: Negative.   Gastrointestinal: Negative.   Genitourinary: Negative.   Musculoskeletal:       Left shoulder wound draining  purulent material  Skin: Negative.   Neurological: Negative.   Endo/Heme/Allergies: Negative.   Psychiatric/Behavioral: Negative.     Blood pressure 135/79, pulse 66, temperature 98.1 F (36.7 C), height  (1.854 m), weight 92.08 kg (203 lb), SpO2 99 %. Physical Exam  Constitutional: He is oriented to person, place, and time. He appears well-developed and well-nourished.  HENT:  Head: Normocephalic and atraumatic.  Mouth/Throat: Oropharynx is clear and moist.  Eyes: Conjunctivae are normal. Pupils are equal, round, and reactive to light.  Neck: Neck supple.  Cardiovascular: Normal rate and regular rhythm.   Respiratory: Effort normal.  GI: Soft.  Genitourinary:  Not pertinent to current symptomatology therefore not examined.  Musculoskeletal:  Left shoulder wound draining purulent material. 1 cm opening.  2+radial pulse.  Neurological: He is alert and oriented to person, place, and time.  Skin:  Open wound draining     Assessment Patient Active Problem List   Diagnosis Date Noted  . Coagulase-negative staphylococcal infection 07/12/2015  . Abscess of left shoulder 06/24/2015  . Wound infection following procedure 06/24/2015  . GERD (gastroesophageal reflux disease)   . Pectoralis muscle rupture     Plan I and D left shoulder   PICC line for IV antibiotics.  Jerry Carey 07/12/2015, 5:51 PM

## 2015-07-16 ENCOUNTER — Encounter (HOSPITAL_COMMUNITY): Payer: Self-pay | Admitting: Orthopedic Surgery

## 2015-07-16 LAB — CBC WITH DIFFERENTIAL/PLATELET
BASOS ABS: 0.1 10*3/uL (ref 0.0–0.1)
BASOS PCT: 1 %
EOS ABS: 0.3 10*3/uL (ref 0.0–0.7)
Eosinophils Relative: 3 %
HEMATOCRIT: 48 % (ref 39.0–52.0)
Hemoglobin: 16.6 g/dL (ref 13.0–17.0)
Lymphocytes Relative: 31 %
Lymphs Abs: 2.7 10*3/uL (ref 0.7–4.0)
MCH: 31.7 pg (ref 26.0–34.0)
MCHC: 34.6 g/dL (ref 30.0–36.0)
MCV: 91.8 fL (ref 78.0–100.0)
MONO ABS: 0.9 10*3/uL (ref 0.1–1.0)
Monocytes Relative: 11 %
NEUTROS ABS: 4.6 10*3/uL (ref 1.7–7.7)
Neutrophils Relative %: 54 %
PLATELETS: 280 10*3/uL (ref 150–400)
RBC: 5.23 MIL/uL (ref 4.22–5.81)
RDW: 12.8 % (ref 11.5–15.5)
WBC: 8.5 10*3/uL (ref 4.0–10.5)

## 2015-07-16 LAB — BASIC METABOLIC PANEL
ANION GAP: 7 (ref 5–15)
BUN: 16 mg/dL (ref 6–20)
CALCIUM: 8.8 mg/dL — AB (ref 8.9–10.3)
CO2: 23 mmol/L (ref 22–32)
CREATININE: 1.03 mg/dL (ref 0.61–1.24)
Chloride: 107 mmol/L (ref 101–111)
GLUCOSE: 82 mg/dL (ref 65–99)
Potassium: 4.3 mmol/L (ref 3.5–5.1)
Sodium: 137 mmol/L (ref 135–145)

## 2015-07-16 MED ORDER — PRO-STAT SUGAR FREE PO LIQD
30.0000 mL | Freq: Three times a day (TID) | ORAL | Status: DC
  Administered 2015-07-16: 30 mL via ORAL
  Filled 2015-07-16 (×4): qty 30

## 2015-07-16 NOTE — Progress Notes (Signed)
Patient upset that he was told that he will be having another surgery on his shoulder. Patient asked this RN what is the big deal about staph in the body- RN educated patient about the bacteria and the risk of it being fatal if we do not help him. Patient states that he understands and did not know that staph could be something that could effect the body that bad. Will continue to monitor patient at this time.

## 2015-07-16 NOTE — Progress Notes (Signed)
Initial Nutrition Assessment  DOCUMENTATION CODES:   Not applicable  INTERVENTION:  Provide 30 ml Prostat po TID, each supplement provides 100 kcal and 15 grams of protein.   Encourage adequate PO intake.   NUTRITION DIAGNOSIS:   Increased nutrient needs related to wound healing as evidenced by estimated needs.  GOAL:   Patient will meet greater than or equal to 90% of their needs  MONITOR:   PO intake, Supplement acceptance, Weight trends, Labs, I & O's, Skin  REASON FOR ASSESSMENT:   Malnutrition Screening Tool    ASSESSMENT:   44 y.o male Tore his left pec and had repair on 06/03/15 Wound dehissed on 06/24/2015 underwent I and D.Wound started draining last Thursday. Presents with left shoulder wound drainage and staph infection.  Procedure(1/19): OPEN INCISION AND DRAINAGE LEFT SHOULDER (Left)  Pt reports having a good appetite currently and PTA with no other difficulties. 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 Meal completion has been 100%. Weight has been stable. Pt is agreeable to Prostat to aid in wound healing. RD to order.   Pt with no observed significant fat or muscle mass loss.   Labs and medications reviewed.   Diet Order:  Diet regular Room service appropriate?: Yes; Fluid consistency:: Thin  Skin:  Wound (see comment) (Incision L should with wound VAC)  Last BM:  1/18  Height:   Ht Readings from Last 1 Encounters:  07/15/15  (1.854 m)    Weight:   Wt Readings from Last 1 Encounters:  07/15/15 204 lb (92.534 kg)    Ideal Body Weight:  83.6 kg  BMI:  Body mass index is 26.92 kg/(m^2).  Estimated Nutritional Needs:   Kcal:  2300-2500  Protein:  110-125 grams  Fluid:  2.3 - 2.5 L/day  EDUCATION NEEDS:   Education needs addressed  Roslyn Smiling, MS, RD, LDN Pager # [phone] After hours/ weekend pager # [phone]

## 2015-07-17 LAB — CBC WITH DIFFERENTIAL/PLATELET
BASOS PCT: 0 %
Basophils Absolute: 0 10*3/uL (ref 0.0–0.1)
EOS ABS: 0.2 10*3/uL (ref 0.0–0.7)
EOS PCT: 3 %
HCT: 44.2 % (ref 39.0–52.0)
HEMOGLOBIN: 15.1 g/dL (ref 13.0–17.0)
LYMPHS ABS: 2.4 10*3/uL (ref 0.7–4.0)
Lymphocytes Relative: 32 %
MCH: 31.1 pg (ref 26.0–34.0)
MCHC: 34.2 g/dL (ref 30.0–36.0)
MCV: 90.9 fL (ref 78.0–100.0)
MONOS PCT: 10 %
Monocytes Absolute: 0.7 10*3/uL (ref 0.1–1.0)
NEUTROS PCT: 55 %
Neutro Abs: 4.1 10*3/uL (ref 1.7–7.7)
PLATELETS: 271 10*3/uL (ref 150–400)
RBC: 4.86 MIL/uL (ref 4.22–5.81)
RDW: 12.6 % (ref 11.5–15.5)
WBC: 7.4 10*3/uL (ref 4.0–10.5)

## 2015-07-17 LAB — BASIC METABOLIC PANEL
Anion gap: 8 (ref 5–15)
BUN: 21 mg/dL — AB (ref 6–20)
CALCIUM: 9.1 mg/dL (ref 8.9–10.3)
CHLORIDE: 105 mmol/L (ref 101–111)
CO2: 25 mmol/L (ref 22–32)
CREATININE: 0.89 mg/dL (ref 0.61–1.24)
GFR calc non Af Amer: >60 mL/min (ref >60)
Glucose, Bld: 77 mg/dL (ref 65–99)
Potassium: 4.1 mmol/L (ref 3.5–5.1)
SODIUM: 138 mmol/L (ref 135–145)

## 2015-07-17 LAB — VANCOMYCIN, TROUGH: VANCOMYCIN TR: 9 ug/mL — AB (ref 10.0–20.0)

## 2015-07-17 MED ORDER — VITAMIN C 500 MG PO TABS
1000.0000 mg | ORAL_TABLET | Freq: Every day | ORAL | Status: DC
  Administered 2015-07-17 – 2015-07-20 (×4): 1000 mg via ORAL
  Filled 2015-07-17 (×4): qty 2

## 2015-07-17 MED ORDER — VANCOMYCIN HCL 10 G IV SOLR
1250.0000 mg | Freq: Three times a day (TID) | INTRAVENOUS | Status: DC
  Administered 2015-07-17 – 2015-07-19 (×6): 1250 mg via INTRAVENOUS
  Filled 2015-07-17 (×7): qty 1250

## 2015-07-17 MED ORDER — ENOXAPARIN SODIUM 40 MG/0.4ML ~~LOC~~ SOLN
40.0000 mg | SUBCUTANEOUS | Status: DC
  Administered 2015-07-19 – 2015-07-22 (×3): 40 mg via SUBCUTANEOUS
  Filled 2015-07-17 (×3): qty 0.4

## 2015-07-17 MED ORDER — FAMOTIDINE 20 MG PO TABS
20.0000 mg | ORAL_TABLET | Freq: Two times a day (BID) | ORAL | Status: DC
  Administered 2015-07-17 – 2015-07-21 (×7): 20 mg via ORAL
  Filled 2015-07-17 (×7): qty 1

## 2015-07-17 MED ORDER — RISAQUAD PO CAPS
1.0000 | ORAL_CAPSULE | Freq: Every day | ORAL | Status: DC
  Administered 2015-07-17 – 2015-07-20 (×4): 1 via ORAL
  Filled 2015-07-17 (×7): qty 1

## 2015-07-17 NOTE — Progress Notes (Signed)
[facility] Progress Note  Subjective  Overall doing well Just frustrated with whole clinical scenario  No specific complaints  A lot of questions asked and answered   Pt afebrile, no elevated WBC   ROS As above   Objective   BP 140/60 mmHg  Pulse 52  Temp(Src) 98.2 F (36.8 C) (Oral)  Resp 18  Ht 6' 1"  (1.854 m)  Wt 92.534 kg (204 lb)  BMI 26.92 kg/m2  SpO2 100%  Intake/Output      01/20 0701 - 01/21 0700 01/21 0701 - 01/22 0700   P.O. 1680 360   I.V. (mL/kg) 400 (4.3)    IV Piggyback 200    Total Intake(mL/kg) 2280 (24.6) 360 (3.9)   Urine (mL/kg/hr) 1 (0)    Drains 5 (0)    Total Output 6     Net +2274 +360        Urine Occurrence 4 x 0 x     Labs Results for Jerry Carey (MRN [id number]) as of 07/17/2015 10:49  Ref. Range 07/17/2015 04:19 07/17/2015 09:38  Sodium Latest Ref Range: 135-145 mmol/L 138   Potassium Latest Ref Range: 3.5-5.1 mmol/L 4.1   Chloride Latest Ref Range: 101-111 mmol/L 105   CO2 Latest Ref Range: 22-32 mmol/L 25   BUN Latest Ref Range: 6-20 mg/dL 21 (H)   Creatinine Latest Ref Range: 0.61-1.24 mg/dL 0.89   Calcium Latest Ref Range: 8.9-10.3 mg/dL 9.1   EGFR (Non-African Amer.) Latest Ref Range: >60 mL/min >60   EGFR (African American) Latest Ref Range: >60 mL/min >60   Glucose Latest Ref Range: 65-99 mg/dL 77   Anion gap Latest Ref Range: 5-15  8   WBC Latest Ref Range: 4.0-10.5 K/uL 7.4   RBC Latest Ref Range: 4.22-5.81 MIL/uL 4.86   Hemoglobin Latest Ref Range: 13.0-17.0 g/dL 15.1   HCT Latest Ref Range: 39.0-52.0 % 44.2   MCV Latest Ref Range: 78.0-100.0 fL 90.9   MCH Latest Ref Range: 26.0-34.0 pg 31.1   MCHC Latest Ref Range: 30.0-36.0 g/dL 34.2   RDW Latest Ref Range: 11.5-15.5 % 12.6   Platelets Latest Ref Range: 150-400 K/uL 271   Neutrophils Latest Units: % 55   Lymphocytes Latest Units: % 32   Monocytes Relative Latest Units: % 10   Eosinophil Latest Units: % 3   Basophil Latest Units: % 0    NEUT# Latest Ref Range: 1.7-7.7 K/uL 4.1   Lymphocyte # Latest Ref Range: 0.7-4.0 K/uL 2.4   Monocyte # Latest Ref Range: 0.1-1.0 K/uL 0.7   Eosinophils Absolute Latest Ref Range: 0.0-0.7 K/uL 0.2   Basophils Absolute Latest Ref Range: 0.0-0.1 K/uL 0.0   Vancomycin Tr Latest Ref Range: 10.0-20.0 ug/mL  9 (L)    Exam  Gen: resting comfortably in bed, NAD Lungs: breathing unlabored Cardiac: Regular  Ext:       Left Upper Extremity   Dressing stable  Good seal of wound vac   Bloody drainage in vac canister but no active drainage noted   Motor and sensory functions grossly intact  Ext warm   + radial pulse     Assessment and Plan   POD/HD#: 1  45 y/o male with deep wound infection L axilla following Pectoralis Major repair   1. Deep infection L axilla s/p I&D  Return to OR tomorrow for repeat I&D  With closure over drain vs home vac vs packing  Continue with motion restrictions  Pt remains on IV vanc and ancef   Ancef  can likely be dc'd    Will order ESR and CRP for tomorrow     Office cultures reported as CoNS, susceptibilities unknown   If dc'd on oral abx may want to consider a class other than fluoroquinolones due to their association with tendon rupture   Would consider ID consult to help guide therapy   2.. Dispo:  Return to OR tomorrow for repeat I&D    Jari Pigg, PA-C [facility] [phone] ([phone] (O) 07/17/2015 10:46 AM

## 2015-07-17 NOTE — Progress Notes (Signed)
Pharmacy Antibiotic Follow-up Note  Jerry Carey is a 45 y.o. year-old male admitted on 07/15/2015.  The patient is currently on day 3 of Vancomycin + Cefazolin for a CoNS deep wound infection. Cultures were apparently taken outpatient and are showing coagulase negative staph - pending susceptibilities. An I&D was done on 1/19 but there appear to be no cultures from this pending.  A Vancomycin trough was drawn this morning however it was 2 hours late. Measured level was 9 mcg/ml however it is estimated the patient's true trough is likely closer to 11-12 mcg/ml. Renal function remains stable. Will increase the dose today.   Assessment/Plan: 1. Adjust Vancomycin to 1250 mg IV every 8 hours 2. Continue Cefazolin 2g IV every 8 hours per MD 3. Will continue to follow renal function, culture results, LOT, and antibiotic de-escalation plans   Temp (24hrs), Avg:98.3 F (36.8 C), Min:98.2 F (36.8 C), Max:98.3 F (36.8 C)   Recent Labs Lab 07/14/15 1124 07/16/15 0550 07/17/15 0419  WBC 6.9 8.5 7.4    Recent Labs Lab 07/14/15 1124 07/16/15 0550 07/17/15 0419  CREATININE 0.99 1.03 0.89   Estimated Creatinine Clearance: 116 mL/min (by C-G formula based on Cr of 0.89).    Allergies  Allergen Reactions  . Chlorhexidine Gluconate Rash    Patient states he had severe burning and rash and was given benadryl    Antimicrobials this admission: Doxy/LVQ PTA Vancomycin 1/19 >> Cefazolin 1/19 >>  Levels/dose changes this admission: * 1/21 VT 9 mcg/ml (1.5 hr late) >> true trough likely ~11 mcg/ml >> will conservatively adjust to 1250 mg/8h  Microbiology results: Outpatient cx >> CoNS (sens unknown)  Thank you for allowing pharmacy to be a part of this patient's care.  Georgina Pillion, PharmD, BCPS Clinical Pharmacist Pager: [phone] 07/17/2015 1:14 PM

## 2015-07-18 ENCOUNTER — Encounter (HOSPITAL_COMMUNITY): Payer: Self-pay | Admitting: Anesthesiology

## 2015-07-18 ENCOUNTER — Inpatient Hospital Stay (HOSPITAL_COMMUNITY): Payer: MEDICAID | Admitting: Anesthesiology

## 2015-07-18 ENCOUNTER — Inpatient Hospital Stay (HOSPITAL_COMMUNITY): Payer: Self-pay | Admitting: Anesthesiology

## 2015-07-18 ENCOUNTER — Encounter (HOSPITAL_COMMUNITY)
Admission: RE | Disposition: A | Discharge status: Home / Self Care | Payer: Self-pay | Source: Ambulatory Visit | Attending: Orthopedic Surgery

## 2015-07-18 HISTORY — PX: IRRIGATION AND DEBRIDEMENT SHOULDER: SHX5880

## 2015-07-18 LAB — CBC WITH DIFFERENTIAL/PLATELET
BASOS ABS: 0 10*3/uL (ref 0.0–0.1)
BASOS PCT: 0 %
Eosinophils Absolute: 0.1 10*3/uL (ref 0.0–0.7)
Eosinophils Relative: 1 %
HEMATOCRIT: 48 % (ref 39.0–52.0)
Hemoglobin: 16.5 g/dL (ref 13.0–17.0)
LYMPHS PCT: 13 %
Lymphs Abs: 1.3 10*3/uL (ref 0.7–4.0)
MCH: 31.1 pg (ref 26.0–34.0)
MCHC: 34.4 g/dL (ref 30.0–36.0)
MCV: 90.4 fL (ref 78.0–100.0)
Monocytes Absolute: 0.8 10*3/uL (ref 0.1–1.0)
Monocytes Relative: 7 %
NEUTROS ABS: 8.4 10*3/uL — AB (ref 1.7–7.7)
NEUTROS PCT: 79 %
Platelets: 325 10*3/uL (ref 150–400)
RBC: 5.31 MIL/uL (ref 4.22–5.81)
RDW: 12.6 % (ref 11.5–15.5)
WBC: 10.6 10*3/uL — ABNORMAL HIGH (ref 4.0–10.5)

## 2015-07-18 LAB — BASIC METABOLIC PANEL
Anion gap: 10 (ref 5–15)
BUN: 12 mg/dL (ref 6–20)
CHLORIDE: 104 mmol/L (ref 101–111)
CO2: 26 mmol/L (ref 22–32)
CREATININE: 0.92 mg/dL (ref 0.61–1.24)
Calcium: 9.4 mg/dL (ref 8.9–10.3)
GFR calc Af Amer: >60 mL/min (ref >60)
GFR calc non Af Amer: >60 mL/min (ref >60)
GLUCOSE: 132 mg/dL — AB (ref 65–99)
POTASSIUM: 3.8 mmol/L (ref 3.5–5.1)
SODIUM: 140 mmol/L (ref 135–145)

## 2015-07-18 LAB — GLUCOSE, CAPILLARY: GLUCOSE-CAPILLARY: 89 mg/dL (ref 65–99)

## 2015-07-18 SURGERY — IRRIGATION AND DEBRIDEMENT SHOULDER
Anesthesia: Regional | Site: Shoulder | Laterality: Left

## 2015-07-18 MED ORDER — LACTATED RINGERS IV SOLN
INTRAVENOUS | Status: DC | PRN
  Administered 2015-07-18: 07:00:00 via INTRAVENOUS

## 2015-07-18 MED ORDER — 0.9 % SODIUM CHLORIDE (POUR BTL) OPTIME
TOPICAL | Status: DC | PRN
  Administered 2015-07-18: 1000 mL

## 2015-07-18 MED ORDER — BUPIVACAINE-EPINEPHRINE (PF) 0.5% -1:200000 IJ SOLN
INTRAMUSCULAR | Status: DC | PRN
  Administered 2015-07-18: 30 mL via PERINEURAL

## 2015-07-18 MED ORDER — PROPOFOL 10 MG/ML IV BOLUS
INTRAVENOUS | Status: DC | PRN
  Administered 2015-07-18: 200 mg via INTRAVENOUS

## 2015-07-18 MED ORDER — HYDROMORPHONE HCL 1 MG/ML IJ SOLN: 0.2500 mg | INTRAMUSCULAR | Status: DC | PRN

## 2015-07-18 MED ORDER — MIDAZOLAM HCL 2 MG/2ML IJ SOLN
INTRAMUSCULAR | Status: AC
  Filled 2015-07-18: qty 2

## 2015-07-18 MED ORDER — MIDAZOLAM HCL 5 MG/5ML IJ SOLN
INTRAMUSCULAR | Status: DC | PRN
  Administered 2015-07-18 (×2): 1 mg via INTRAVENOUS

## 2015-07-18 MED ORDER — ONDANSETRON HCL 4 MG/2ML IJ SOLN
INTRAMUSCULAR | Status: DC | PRN
  Administered 2015-07-18: 4 mg via INTRAVENOUS

## 2015-07-18 MED ORDER — ONDANSETRON HCL 4 MG/2ML IJ SOLN
INTRAMUSCULAR | Status: AC
  Filled 2015-07-18: qty 2

## 2015-07-18 MED ORDER — PROPOFOL 10 MG/ML IV BOLUS
INTRAVENOUS | Status: AC
  Filled 2015-07-18: qty 40

## 2015-07-18 MED ORDER — FENTANYL CITRATE (PF) 100 MCG/2ML IJ SOLN
INTRAMUSCULAR | Status: DC | PRN
  Administered 2015-07-18 (×2): 50 ug via INTRAVENOUS

## 2015-07-18 MED ORDER — PROMETHAZINE HCL 25 MG/ML IJ SOLN: 6.2500 mg | INTRAMUSCULAR | Status: DC | PRN

## 2015-07-18 MED ORDER — MEPERIDINE HCL 25 MG/ML IJ SOLN: 6.2500 mg | INTRAMUSCULAR | Status: DC | PRN

## 2015-07-18 MED ORDER — PHENYLEPHRINE HCL 10 MG/ML IJ SOLN
INTRAMUSCULAR | Status: DC | PRN
  Administered 2015-07-18: 40 ug via INTRAVENOUS

## 2015-07-18 MED ORDER — FENTANYL CITRATE (PF) 250 MCG/5ML IJ SOLN
INTRAMUSCULAR | Status: AC
  Filled 2015-07-18: qty 5

## 2015-07-18 SURGICAL SUPPLY — 49 items
BOOTCOVER CLEANROOM LRG (PROTECTIVE WEAR) IMPLANT
CANISTER WOUND CARE 500ML ATS (WOUND CARE) ×2 IMPLANT
CASSETTE VERAFLO VERALINK (MISCELLANEOUS) ×2 IMPLANT
COVER SURGICAL LIGHT HANDLE (MISCELLANEOUS) ×2 IMPLANT
DRAPE U-SHAPE 47X51 STRL (DRAPES) ×2 IMPLANT
DRSG MEPILEX BORDER 4X8 (GAUZE/BANDAGES/DRESSINGS) IMPLANT
DRSG PAD ABDOMINAL 8X10 ST (GAUZE/BANDAGES/DRESSINGS) IMPLANT
DURAPREP 26ML APPLICATOR (WOUND CARE) ×2 IMPLANT
ELECT REM PT RETURN 9FT ADLT (ELECTROSURGICAL)
ELECTRODE REM PT RTRN 9FT ADLT (ELECTROSURGICAL) IMPLANT
EVACUATOR 1/8 PVC DRAIN (DRAIN) IMPLANT
FACESHIELD WRAPAROUND (MASK) ×2 IMPLANT
GAUZE SPONGE 4X4 12PLY STRL (GAUZE/BANDAGES/DRESSINGS) ×6 IMPLANT
GAUZE XEROFORM 1X8 LF (GAUZE/BANDAGES/DRESSINGS) IMPLANT
GLOVE BIO SURGEON STRL SZ7.5 (GLOVE) ×2 IMPLANT
GLOVE BIO SURGEON STRL SZ8 (GLOVE) ×4 IMPLANT
GLOVE BIOGEL PI IND STRL 7.5 (GLOVE) ×1 IMPLANT
GLOVE BIOGEL PI IND STRL 8 (GLOVE) ×1 IMPLANT
GLOVE BIOGEL PI INDICATOR 7.5 (GLOVE) ×1
GLOVE BIOGEL PI INDICATOR 8 (GLOVE) ×1
GLOVE SURG ORTHO 9.0 STRL STRW (GLOVE) ×2 IMPLANT
GOWN STRL REUS W/ TWL LRG LVL3 (GOWN DISPOSABLE) ×1 IMPLANT
GOWN STRL REUS W/TWL LRG LVL3 (GOWN DISPOSABLE) ×1
GOWN STRL REUS W/TWL XL LVL4 (GOWN DISPOSABLE) ×2 IMPLANT
HANDPIECE INTERPULSE COAX TIP (DISPOSABLE) ×1
KIT BASIN OR (CUSTOM PROCEDURE TRAY) ×2 IMPLANT
KIT ROOM TURNOVER OR (KITS) ×2 IMPLANT
MANIFOLD NEPTUNE II (INSTRUMENTS) ×2 IMPLANT
NS IRRIG 1000ML POUR BTL (IV SOLUTION) ×2 IMPLANT
PACK SHOULDER (CUSTOM PROCEDURE TRAY) ×2 IMPLANT
PAD ARMBOARD 7.5X6 YLW CONV (MISCELLANEOUS) ×4 IMPLANT
SET HNDPC FAN SPRY TIP SCT (DISPOSABLE) ×1 IMPLANT
SPONGE LAP 18X18 X RAY DECT (DISPOSABLE) ×2 IMPLANT
STOCKINETTE IMPERVIOUS LG (DRAPES) ×2 IMPLANT
SUPPORT WRAP ARM LG (MISCELLANEOUS) IMPLANT
SUT ETHILON 3 0 PS 1 (SUTURE) IMPLANT
SUT VIC AB 0 CT1 27 (SUTURE)
SUT VIC AB 0 CT1 27XBRD ANBCTR (SUTURE) IMPLANT
SUT VIC AB 2-0 CT1 27 (SUTURE)
SUT VIC AB 2-0 CT1 TAPERPNT 27 (SUTURE) IMPLANT
SWAB COLLECTION DEVICE MRSA (MISCELLANEOUS) IMPLANT
TOWEL OR 17X24 6PK STRL BLUE (TOWEL DISPOSABLE) ×2 IMPLANT
TOWEL OR 17X26 10 PK STRL BLUE (TOWEL DISPOSABLE) ×2 IMPLANT
TUBE ANAEROBIC SPECIMEN COL (MISCELLANEOUS) IMPLANT
TUBE CONNECTING 12X1/4 (SUCTIONS) ×2 IMPLANT
TUBING VERAFLO DUO SET (SET/KITS/TRAYS/PACK) ×2 IMPLANT
UNDERPAD 30X30 INCONTINENT (UNDERPADS AND DIAPERS) ×2 IMPLANT
WATER STERILE IRR 1000ML POUR (IV SOLUTION) ×2 IMPLANT
YANKAUER SUCT BULB TIP NO VENT (SUCTIONS) ×2 IMPLANT

## 2015-07-18 NOTE — Anesthesia Procedure Notes (Addendum)
Anesthesia Regional Block:  Interscalene brachial plexus block  Pre-Anesthetic Checklist: ,, timeout performed, Correct Patient, Correct Site, Correct Laterality, Correct Procedure, Correct Position, site marked, Risks and benefits discussed, Surgical consent,  Pre-op evaluation,  Post-op pain management  Laterality: Left  Prep: chloraprep       Needles:  Injection technique: Single-shot  Needle Type: Stimulator Needle - 40     Needle Length: 4cm 4 cm Needle Gauge: 22 and 22 G    Additional Needles:  Procedures: ultrasound guided (picture in chart) Interscalene brachial plexus block Narrative:  Injection made incrementally with aspirations every 5 mL.  Performed by: Personally  Anesthesiologist: Lewie Loron  Additional Notes: BP cuff, EKG monitors applied. Sedation begun. Nerve location verified with U/S. Anesthetic injected incrementally, slowly , and after neg aspirations under direct u/s guidance. Good perineural spread. Tolerated well.   Procedure Name: LMA Insertion Date/Time: 07/18/2015 8:04 AM Performed by: Marni Griffon Pre-anesthesia Checklist: Emergency Drugs available, Patient identified, Suction available and Patient being monitored Patient Re-evaluated:Patient Re-evaluated prior to inductionOxygen Delivery Method: Circle system utilized Preoxygenation: Pre-oxygenation with 100% oxygen Intubation Type: IV induction Ventilation: Mask ventilation without difficulty LMA: LMA inserted LMA Size: 5.0 Number of attempts: 1 Placement Confirmation: breath sounds checked- equal and bilateral and positive ETCO2 Tube secured with: Tape (taped across cheeks) Dental Injury: Teeth and Oropharynx as per pre-operative assessment

## 2015-07-18 NOTE — Anesthesia Preprocedure Evaluation (Addendum)
Anesthesia Evaluation  Patient identified by MRN, date of birth, ID band Patient awake    Reviewed: Allergy & Precautions, NPO status , Patient's Chart, lab work & pertinent test results  History of Anesthesia Complications (+) Emergence Delirium and history of anesthetic complications  Airway Mallampati: II  TM Distance: >3 FB Neck ROM: Full    Dental  (+) Dental Advisory Given, Chipped   Pulmonary pneumonia, former smoker,    breath sounds clear to auscultation       Cardiovascular (-) anginanegative cardio ROS   Rhythm:Regular Rate:Normal     Neuro/Psych negative neurological ROS     GI/Hepatic Neg liver ROS, GERD  Medicated and Controlled,  Endo/Other  negative endocrine ROS  Renal/GU negative Renal ROS     Musculoskeletal   Abdominal   Peds  Hematology negative hematology ROS (+)   Anesthesia Other Findings Anabolic steroid user  Reproductive/Obstetrics                            Anesthesia Physical  Anesthesia Plan  ASA: II  Anesthesia Plan: General and Regional   Post-op Pain Management: GA combined w/ Regional for post-op pain   Induction: Intravenous  Airway Management Planned: LMA  Additional Equipment:   Intra-op Plan:   Post-operative Plan:   Informed Consent: I have reviewed the patients History and Physical, chart, labs and discussed the procedure including the risks, benefits and alternatives for the proposed anesthesia with the patient or authorized representative who has indicated his/her understanding and acceptance.   Dental advisory given  Plan Discussed with: CRNA and Surgeon  Anesthesia Plan Comments: (Plan routine monitors, GA- LMA OK)        Anesthesia Quick Evaluation

## 2015-07-18 NOTE — Progress Notes (Signed)
Received notification from primary RN that patient had a syncopal episode while attempting to use the restroom.  Upon arrival to patient's room, primary RN and PACU RN present.  Rapid response notified.  VVS, CBG 89 within normal limits.  MD made aware, to come see patient at bedside shortly.  Will continue to monitor patient.

## 2015-07-18 NOTE — Progress Notes (Signed)
Called Stat to room for patient just returned from PACU, when he stood up and had a syncopal episode.  Upon my arrival to patients room,  Primary Rn, PACU  RN and family.  Patient lying in trenbelenburg position, on nasal cannula 4 lpm awake and oriented.  VSS, CBG 89.  Patient has full body shakes, and states he can't stop shaking.  Skin is warm and dry.  MD called and updated-will come see.  No RRT interventions.  RN to call if assistance needede

## 2015-07-18 NOTE — Op Note (Signed)
Jerry Carey, Jerry Carey           ACCOUNT NO.:  [id number]  MEDICAL RECORD NO.:  [id number]  LOCATION:  [facility]                         FACILITY:  [facility]  PHYSICIAN:  Mabel Unrein A. Thurston Hole, M.D. DATE OF BIRTH:  Sep 26, 1970  DATE OF PROCEDURE:  07/18/2015 DATE OF DISCHARGE:                              OPERATIVE REPORT   PREOPERATIVE DIAGNOSIS:  Left shoulder infection with wound dehiscence, status post pectoralis major repair.  POSTOPERATIVE DIAGNOSIS:  Left shoulder infection with wound dehiscence, status post pectoralis major repair.  PROCEDURE:  Left shoulder irrigations and debridement followed by wound VAC replacement.  SURGEON:  Elana Alm. Thurston Hole, M.D.  ASSISTANT:  Doralee Albino. Carola Frost, M.D. and Nadara Mustard, M.D.  ANESTHESIA:  General.  OPERATIVE TIME:  1 hour.  COMPLICATIONS:  None.  INDICATION FOR PROCEDURE:  Jerry Carey is a 45 year old gentleman, who had undergone a left shoulder pectoralis major repair 47 weeks ago. Two weeks postoperatively, he developed a wound dehiscence and an infection, which subsequently required irrigation and debridement. Despite this, he did not heal this wound and continued to have an infection, on antibiotics and thus, 4 days ago, he was brought back to the operating room for further irrigation, debridement, and wound VAC application.  He is now brought back to the operating room today for further irrigation, debridement, and replacement of this wound VAC with a VeraFlo wound VAC system.  DESCRIPTION:  Jerry Carey was brought to the operating room on July 18, 2015, placed on the operative table in supine position.  He received an interscalene block by anesthesia prior to being placed under general anesthesia.  After being placed under general anesthesia, was placed in beach chair position.  His left shoulder was examined.  He had the obvious open wound where he had had part of the wound VAC previously placed 4 days ago.  Also,  persistent sutures had been placed superior and inferior to the rest of the wound, which were intact.  No obvious purulence was noted.  The wound VAC was removed.  The shoulder was prepped using sterile DuraPrep and draped using sterile technique.  Time- out procedure was called.  The correct left shoulder identified.  He received vancomycin IV on his regular scheduled dose during the procedure.  Thorough irrigation was carried out with 2 L of saline. There was partial healthy granulation tissue on part of the wound, but the medial part had still not had satisfactory wound granulation, and this area was further scraped and debrided, and after the wound was thoroughly irrigated, Dr. Aldean Baker also scrubbed in because of his expertise and along with Dr. Carola Frost, the wound [facility] usage in trauma patients as well as in other typical wound patients.  Deferred to their expertise on using this VeraFlo wound VAC, which Dr. Lajoyce Corners felt was extremely valuable and necessary to help heal this wound.  The wound VAC material was placed deep in the wound, and then, the seal was applied and the vacuum applied.  After this was done, then the patient was awakened and taken to the recovery room in stable condition.  Needle, sponge counts correct x2 at the end of the case.  FOLLOWUP CARE:  Jerry Carey will remain on  IV antibiotics with this wound VAC in place.  He will most likely be required to bring back to the operating room in 3 days for further wound VAC changing as well as IV antibiotics.     [name] A. Thurston Hole, M.D.     RAW/MEDQ  D:  07/18/2015  T:  07/18/2015  Job:  [id number]

## 2015-07-18 NOTE — Transfer of Care (Signed)
Immediate Anesthesia Transfer of Care Note  Patient: Jerry Carey  Procedure(s) Performed: Procedure(s): IRRIGATION AND DEBRIDEMENT , VERAFLO WOUND VAC PLACEMENT (Left)  Patient Location: PACU  Anesthesia Type:General  Level of Consciousness: awake, sedated and patient cooperative  Airway & Oxygen Therapy: Patient Spontanous Breathing and Patient connected to nasal cannula oxygen  Post-op Assessment: Report given to RN, Post -op Vital signs reviewed and stable and Patient moving all extremities  Post vital signs: Reviewed and stable  Last Vitals:  Filed Vitals:   07/17/15 2202 07/18/15 0552  BP: 141/94 105/53  Pulse: 69 83  Temp: 37.3 C 37 C  Resp: 18 16    Complications: No apparent anesthesia complications

## 2015-07-18 NOTE — Progress Notes (Signed)
Pt arrived back to the unit around 1030. Pt went to bathroom with assistance from wife. While in the bathroom, he became diaphoretic and nauseated. Pt was transported back to his bed where he passed out. Charge nurse, PACU nurse and other staff at the bedside. Pt became alert and able to speak. VS looked great. Pt was informed in PACU it was not a good idea to go to the bathroom just yet since he just had surgery. Was encouraged to use urinal but he did not want to use that. Pt became oriented after a period of being cold and shaking. He has no other s/s of pain or distress. Wife remains at the bedside. Pt has eaten and had drinks.

## 2015-07-18 NOTE — Progress Notes (Signed)
Pt transported to floor by RN, pt very concerned about block in l.arm and having tingling in hand, pt can move his whole hand but decreased sensation r/t block.  Extensive education done regarding the kind of block he had and that decreased sensation is completely normal.  Pt verbalizes understanding, then immediately states he is concerned about his hand and believes something is wrong.  Upon arrival to floor, he began to c/o of shortness of breath and "just feels like I can't take a deep breath," pt's lungs CTA bilaterally, no apparent respiratory distress, VSS, 99-100% RA.  While educating patient about normal side effects r/t block,  pt asked to go to bathroom, told him to use urinal, but he refused, pt ambulated to bathroom with floor RN and tech, RN reports patient is weak and diaphoretic, pt immediately returned to bed with assistance, weak but answering orientation question, following commands approp., placed supine, VSS, floor RN called rapid response RN for evaluation, blood sugar checked 89, pt in no respiratory dirtress, began to shiver and ask "whats wrong with me,", Dr.Germeroth called and updated on patient status, will come by to evaluate patient, no interventions needed at this time, family at bedside soothing patient.

## 2015-07-18 NOTE — Anesthesia Postprocedure Evaluation (Signed)
Anesthesia Post Note  Patient: Jerry Carey  Procedure(s) Performed: Procedure(s) (LRB): IRRIGATION AND DEBRIDEMENT , VERAFLO WOUND VAC PLACEMENT (Left)  Patient location during evaluation: PACU Anesthesia Type: General and Regional Level of consciousness: sedated and patient cooperative Pain management: pain level controlled Vital Signs Assessment: post-procedure vital signs reviewed and stable Respiratory status: spontaneous breathing Cardiovascular status: stable Anesthetic complications: no    Last Vitals:  Filed Vitals:   07/18/15 1029 07/18/15 1041  BP: 140/70 115/61  Pulse: 87 65  Temp: 36.7 C 36.4 C  Resp: 18 18    Last Pain:  Filed Vitals:   07/18/15 1041  PainSc: 5                  Lewie Loron

## 2015-07-18 NOTE — Progress Notes (Signed)
Patient to the OR this am at 0658.

## 2015-07-19 ENCOUNTER — Encounter (HOSPITAL_COMMUNITY): Payer: Self-pay | Admitting: Orthopedic Surgery

## 2015-07-19 LAB — BASIC METABOLIC PANEL
Anion gap: 7 (ref 5–15)
BUN: 11 mg/dL (ref 6–20)
CALCIUM: 9.3 mg/dL (ref 8.9–10.3)
CO2: 27 mmol/L (ref 22–32)
CREATININE: 1.01 mg/dL (ref 0.61–1.24)
Chloride: 106 mmol/L (ref 101–111)
Glucose, Bld: 90 mg/dL (ref 65–99)
Potassium: 4.2 mmol/L (ref 3.5–5.1)
SODIUM: 140 mmol/L (ref 135–145)

## 2015-07-19 LAB — CBC WITH DIFFERENTIAL/PLATELET
BASOS ABS: 0 10*3/uL (ref 0.0–0.1)
Basophils Relative: 1 %
EOS ABS: 0.2 10*3/uL (ref 0.0–0.7)
EOS PCT: 2 %
HCT: 47.8 % (ref 39.0–52.0)
Hemoglobin: 16.5 g/dL (ref 13.0–17.0)
LYMPHS PCT: 34 %
Lymphs Abs: 2.8 10*3/uL (ref 0.7–4.0)
MCH: 31.4 pg (ref 26.0–34.0)
MCHC: 34.5 g/dL (ref 30.0–36.0)
MCV: 90.9 fL (ref 78.0–100.0)
Monocytes Absolute: 0.8 10*3/uL (ref 0.1–1.0)
Monocytes Relative: 9 %
Neutro Abs: 4.5 10*3/uL (ref 1.7–7.7)
Neutrophils Relative %: 54 %
PLATELETS: 304 10*3/uL (ref 150–400)
RBC: 5.26 MIL/uL (ref 4.22–5.81)
RDW: 12.6 % (ref 11.5–15.5)
WBC: 8.3 10*3/uL (ref 4.0–10.5)

## 2015-07-19 LAB — SEDIMENTATION RATE: SED RATE: 5 mm/h (ref 0–16)

## 2015-07-19 LAB — VANCOMYCIN, TROUGH: VANCOMYCIN TR: 13 ug/mL (ref 10.0–20.0)

## 2015-07-19 LAB — C-REACTIVE PROTEIN: CRP: <0.5 mg/dL (ref <1.0)

## 2015-07-19 MED ORDER — VANCOMYCIN HCL 10 G IV SOLR
1500.0000 mg | Freq: Three times a day (TID) | INTRAVENOUS | Status: DC
  Administered 2015-07-19 – 2015-07-20 (×5): 1500 mg via INTRAVENOUS
  Filled 2015-07-19 (×8): qty 1500

## 2015-07-19 MED ORDER — HYDROMORPHONE HCL 1 MG/ML IJ SOLN
0.5000 mg | INTRAMUSCULAR | Status: DC | PRN
  Administered 2015-07-19 – 2015-07-20 (×3): 0.5 mg via INTRAVENOUS
  Filled 2015-07-19 (×3): qty 1

## 2015-07-19 NOTE — Progress Notes (Signed)
Pharmacy Antibiotic Follow-up Note  Jerry Carey is a 45 y.o. year-old male admitted on 07/15/2015.  The patient is currently on day 3 of Vancomycin + Cefazolin for a CoNS deep wound infection. Cultures were apparently taken outpatient and are showing coagulase negative staph - pending susceptibilities. An I&D was done on 1/19 but there appear to be no cultures from this pending.  A vancomycin trough was drawn this morning at 13 mcg/ml. Renal function remains stable. Will increase the dose today.   Assessment/Plan: Adjust Vancomycin to 1500 mg IV q8h Will continue to follow renal function, culture results, LOT, and antibiotic de-escalation plans   Temp (24hrs), Avg:98.3 F (36.8 C), Min:97.6 F (36.4 C), Max:98.9 F (37.2 C)   Recent Labs Lab 07/14/15 1124 07/16/15 0550 07/17/15 0419 07/18/15 1429 07/19/15 0630  WBC 6.9 8.5 7.4 10.6* 8.3     Recent Labs Lab 07/14/15 1124 07/16/15 0550 07/17/15 0419 07/18/15 1429 07/19/15 0630  CREATININE 0.99 1.03 0.89 0.92 1.01   Estimated Creatinine Clearance: 102.2 mL/min (by C-G formula based on Cr of 1.01).    Allergies  Allergen Reactions  . Chlorhexidine Gluconate Rash    Patient states he had severe burning and rash and was given benadryl    Antimicrobials this admission: Doxy/LVQ PTA Vancomycin 1/19 >> Cefazolin 1/19 >>  Levels/dose changes this admission: * 1/21 VT 9 mcg/ml (1.5 hr late) >> true trough likely ~11 mcg/ml >> will conservatively adjust to 1250 mg/8h 1/23 VT: 13 mcg/mL drawn appropriately  Microbiology results: Outpatient cx >> CoNS (sens unknown)  Thank you for allowing pharmacy to be a part of this patient's care.  Arlean Hopping. Newman Pies, PharmD, BCPS Clinical Pharmacist Pager [phone]  07/19/2015 10:14 AM

## 2015-07-19 NOTE — Progress Notes (Signed)
Received a call from Dr Lajoyce Corners office to change wound vac from 12cc to [facility]

## 2015-07-19 NOTE — Progress Notes (Signed)
Patient and girlfriend lying on couch bed in patients room. RN called into the room patients room and was informed that patients girlfriend pulled patients wound vac tubing leading him to the couch bed. Patient stated that he then felt something pull out of his body. Patient then stated that the flushing system began and the site began to leak fluid. RN assessed patients dressing. Tape was clean, dry, and intact with no current leaking. RN called the consulting company for the product ([employer]). RN was informed from the consultant that if there was no "air leak" warning on the screen that there would be no need to replace the entire dressing. Consultant informed RN that the moisture from the leak will actually make the tape adhere even more because the suction from the wound vac system will eventually pull out that moisture as well. RN informed to reinforce dressing with more tegaderm. RN placed more tegaderm on patients wound vac site. Nursing will continue to monitor.

## 2015-07-19 NOTE — Progress Notes (Signed)
Subjective: 1 Day Post-Op Procedure(s) (LRB): IRRIGATION AND DEBRIDEMENT , VERAFLO WOUND VAC PLACEMENT (Left) Patient reports pain as 4 on 0-10 scale.    Objective: Vital signs in last 24 hours: Temp:  [98.5 F (36.9 C)-98.9 F (37.2 C)] 98.5 F (36.9 C) (01/23 0746) Pulse Rate:  [85-99] 85 (01/23 0746) Resp:  [16-18] 16 (01/23 0746) BP: (146-164)/(70-84) 152/76 mmHg (01/23 0746) SpO2:  [97 %-98 %] 97 % (01/23 0746)  Intake/Output from previous day: 01/22 0701 - 01/23 0700 In: 600 [I.V.:600] Out: 305 [Drains:300; Blood:5] Intake/Output this shift: Total I/O In: -  Out: 175 [Drains:175]   Recent Labs  07/17/15 0419 07/18/15 1429 07/19/15 0630  HGB 15.1 16.5 16.5    Recent Labs  07/18/15 1429 07/19/15 0630  WBC 10.6* 8.3  RBC 5.31 5.26  HCT 48.0 47.8  PLT 325 304    Recent Labs  07/18/15 1429 07/19/15 0630  NA 140 140  K 3.8 4.2  CL 104 106  CO2 26 27  BUN 12 11  CREATININE 0.92 1.01  GLUCOSE 132* 90  CALCIUM 9.4 9.3   No results for input(s): LABPT, INR in the last 72 hours.  ABD soft Neurovascular intact Sensation intact distally Intact pulses distally Dorsiflexion/Plantar flexion intact Incision: scant drainage  Assessment/Plan: 1 Day Post-Op Procedure(s) (LRB): IRRIGATION AND DEBRIDEMENT , VERAFLO WOUND VAC PLACEMENT (Left)  Active Problems:   GERD (gastroesophageal reflux disease)   Pectoralis muscle rupture   Abscess of left shoulder   Wound infection following procedure   Coagulase-negative staphylococcal infection  Advance diet Up with therapy  Jerry Carey J 07/19/2015, 2:35 PM

## 2015-07-19 NOTE — Anesthesia Postprocedure Evaluation (Signed)
Anesthesia Post Note  Patient: Jerry Carey  Procedure(s) Performed: Procedure(s) (LRB): IRRIGATION AND DEBRIDEMENT , VERAFLO WOUND VAC PLACEMENT (Left)  Patient location during evaluation: PACU Anesthesia Type: General and Regional Level of consciousness: sedated and patient cooperative Pain management: pain level controlled Vital Signs Assessment: post-procedure vital signs reviewed and stable Respiratory status: spontaneous breathing Cardiovascular status: stable Anesthetic complications: no    Last Vitals:  Filed Vitals:   07/18/15 1455 07/18/15 2212  BP: 164/70 146/84  Pulse: 91 99  Temp: 37.2 C 36.9 C  Resp: 18 18    Last Pain:  Filed Vitals:   07/18/15 2213  PainSc: 7                  Lewie Loron

## 2015-07-20 LAB — CBC WITH DIFFERENTIAL/PLATELET
BASOS ABS: 0 10*3/uL (ref 0.0–0.1)
BASOS PCT: 0 %
EOS PCT: 2 %
Eosinophils Absolute: 0.2 10*3/uL (ref 0.0–0.7)
HCT: 48.2 % (ref 39.0–52.0)
Hemoglobin: 16.3 g/dL (ref 13.0–17.0)
LYMPHS PCT: 27 %
Lymphs Abs: 2.6 10*3/uL (ref 0.7–4.0)
MCH: 30.8 pg (ref 26.0–34.0)
MCHC: 33.8 g/dL (ref 30.0–36.0)
MCV: 90.9 fL (ref 78.0–100.0)
Monocytes Absolute: 0.8 10*3/uL (ref 0.1–1.0)
Monocytes Relative: 8 %
NEUTROS ABS: 6 10*3/uL (ref 1.7–7.7)
Neutrophils Relative %: 63 %
PLATELETS: 333 10*3/uL (ref 150–400)
RBC: 5.3 MIL/uL (ref 4.22–5.81)
RDW: 12.5 % (ref 11.5–15.5)
WBC: 9.6 10*3/uL (ref 4.0–10.5)

## 2015-07-20 NOTE — Progress Notes (Signed)
Subjective: 2 Days Post-Op Procedure(s) (LRB): IRRIGATION AND DEBRIDEMENT , VERAFLO WOUND VAC PLACEMENT (Left) Patient reports pain as 3 on 0-10 scale.    Objective: Vital signs in last 24 hours: Temp:  [98.1 F (36.7 C)-98.9 F (37.2 C)] 98.1 F (36.7 C) (01/24 0500) Pulse Rate:  [66-93] 66 (01/24 0500) Resp:  [16-18] 17 (01/24 0500) BP: (133-152)/(65-76) 133/65 mmHg (01/24 0500) SpO2:  [94 %-100 %] 100 % (01/24 0500)  Intake/Output from previous day: 01/23 0701 - 01/24 0700 In: 1566 [P.O.:1440; I.V.:120] Out: 175 [Drains:175] Intake/Output this shift:     Recent Labs  07/18/15 1429 07/19/15 0630  HGB 16.5 16.5    Recent Labs  07/18/15 1429 07/19/15 0630  WBC 10.6* 8.3  RBC 5.31 5.26  HCT 48.0 47.8  PLT 325 304    Recent Labs  07/18/15 1429 07/19/15 0630  NA 140 140  K 3.8 4.2  CL 104 106  CO2 26 27  BUN 12 11  CREATININE 0.92 1.01  GLUCOSE 132* 90  CALCIUM 9.4 9.3   No results for input(s): LABPT, INR in the last 72 hours.  ABD soft Neurovascular intact Sensation intact distally Incision: scant drainage  Assessment/Plan: 2 Days Post-Op Procedure(s) (LRB): IRRIGATION AND DEBRIDEMENT , VERAFLO WOUND VAC PLACEMENT (Left)  Dr Lajoyce Corners notified that Veraflo vac leaking despite reinforcement Advance diet Up with therapy  Going to the OR tomorrow for repeat I and D  Magali Bray J 07/20/2015, 7:34 AM

## 2015-07-20 NOTE — Progress Notes (Signed)
   07/20/15 1310  Clinical Encounter Type  Visited With Patient;Health care provider  Visit Type Initial;Spiritual support;Pre-op  Referral From Patient;Nurse  Spiritual Encounters  Spiritual Needs Prayer;Emotional  Stress Factors  Patient Stress Factors Financial concerns   Chaplain responded to a request to visit with the patient, who expressed a lot of fear. Patient is afraid of another surgery, afraid of financial consequences, and afraid that fear is not compatible with his faith. Chaplain offered support, reviewed sacred texts, and offered prayer. Chaplain will seek to follow-up with patient before tomorrow's surgery, and our services are available as needed.   Alda Ponder, Chaplain 07/20/2015 1:13 PM

## 2015-07-21 ENCOUNTER — Inpatient Hospital Stay (HOSPITAL_COMMUNITY): Payer: Self-pay | Admitting: Anesthesiology

## 2015-07-21 ENCOUNTER — Inpatient Hospital Stay (HOSPITAL_COMMUNITY): Payer: MEDICAID | Admitting: Anesthesiology

## 2015-07-21 ENCOUNTER — Encounter (HOSPITAL_COMMUNITY)
Admission: RE | Disposition: A | Discharge status: Home / Self Care | Payer: Self-pay | Source: Ambulatory Visit | Attending: Orthopedic Surgery

## 2015-07-21 HISTORY — PX: I & D EXTREMITY: SHX5045

## 2015-07-21 LAB — CBC WITH DIFFERENTIAL/PLATELET
BASOS ABS: 0 10*3/uL (ref 0.0–0.1)
Basophils Relative: 1 %
Eosinophils Absolute: 0.2 10*3/uL (ref 0.0–0.7)
Eosinophils Relative: 3 %
HEMATOCRIT: 41.9 % (ref 39.0–52.0)
Hemoglobin: 14.4 g/dL (ref 13.0–17.0)
LYMPHS ABS: 2.2 10*3/uL (ref 0.7–4.0)
LYMPHS PCT: 34 %
MCH: 31.2 pg (ref 26.0–34.0)
MCHC: 34.4 g/dL (ref 30.0–36.0)
MCV: 90.7 fL (ref 78.0–100.0)
Monocytes Absolute: 0.8 10*3/uL (ref 0.1–1.0)
Monocytes Relative: 13 %
NEUTROS ABS: 3.1 10*3/uL (ref 1.7–7.7)
Neutrophils Relative %: 49 %
Platelets: 286 10*3/uL (ref 150–400)
RBC: 4.62 MIL/uL (ref 4.22–5.81)
RDW: 12.5 % (ref 11.5–15.5)
WBC: 6.3 10*3/uL (ref 4.0–10.5)

## 2015-07-21 LAB — SURGICAL PCR SCREEN
MRSA, PCR: NEGATIVE
Staphylococcus aureus: NEGATIVE

## 2015-07-21 LAB — VANCOMYCIN, TROUGH: VANCOMYCIN TR: 23 ug/mL — AB (ref 10.0–20.0)

## 2015-07-21 SURGERY — IRRIGATION AND DEBRIDEMENT EXTREMITY
Anesthesia: General | Site: Shoulder | Laterality: Left

## 2015-07-21 MED ORDER — SODIUM CHLORIDE 0.9 % IR SOLN
Status: DC | PRN
  Administered 2015-07-21 (×3): 1000 mL

## 2015-07-21 MED ORDER — VANCOMYCIN HCL 500 MG IV SOLR
INTRAVENOUS | Status: AC
  Filled 2015-07-21: qty 500

## 2015-07-21 MED ORDER — LIDOCAINE HCL (CARDIAC) 20 MG/ML IV SOLN
INTRAVENOUS | Status: DC | PRN
  Administered 2015-07-21: 100 mg via INTRAVENOUS

## 2015-07-21 MED ORDER — PROPOFOL 10 MG/ML IV BOLUS
INTRAVENOUS | Status: AC
  Filled 2015-07-21: qty 20

## 2015-07-21 MED ORDER — NEOSTIGMINE METHYLSULFATE 10 MG/10ML IV SOLN
INTRAVENOUS | Status: DC | PRN
  Administered 2015-07-21: 4 mg via INTRAVENOUS

## 2015-07-21 MED ORDER — POTASSIUM CHLORIDE IN NACL 20-0.9 MEQ/L-% IV SOLN
INTRAVENOUS | Status: DC
Start: 2015-07-21 — End: 2015-07-22
  Administered 2015-07-21: 17:00:00 via INTRAVENOUS
  Filled 2015-07-21 (×2): qty 1000

## 2015-07-21 MED ORDER — ONDANSETRON HCL 4 MG PO TABS: 4.0000 mg | ORAL_TABLET | Freq: Four times a day (QID) | ORAL | Status: DC | PRN

## 2015-07-21 MED ORDER — ONDANSETRON HCL 4 MG/2ML IJ SOLN
INTRAMUSCULAR | Status: DC | PRN
  Administered 2015-07-21: 4 mg via INTRAVENOUS

## 2015-07-21 MED ORDER — PHENYLEPHRINE 40 MCG/ML (10ML) SYRINGE FOR IV PUSH (FOR BLOOD PRESSURE SUPPORT)
PREFILLED_SYRINGE | INTRAVENOUS | Status: AC
  Filled 2015-07-21: qty 10

## 2015-07-21 MED ORDER — OXYCODONE HCL 5 MG PO TABS
5.0000 mg | ORAL_TABLET | ORAL | Status: DC | PRN
  Administered 2015-07-21 – 2015-07-22 (×3): 10 mg via ORAL
  Filled 2015-07-21 (×3): qty 2

## 2015-07-21 MED ORDER — BUPIVACAINE HCL (PF) 0.25 % IJ SOLN
INTRAMUSCULAR | Status: DC | PRN
  Administered 2015-07-21: 30 mL

## 2015-07-21 MED ORDER — PROMETHAZINE HCL 25 MG/ML IJ SOLN
6.2500 mg | INTRAMUSCULAR | Status: DC | PRN
Start: 2015-07-21 — End: 2015-07-21

## 2015-07-21 MED ORDER — MIDAZOLAM HCL 2 MG/2ML IJ SOLN
INTRAMUSCULAR | Status: AC
  Filled 2015-07-21: qty 2

## 2015-07-21 MED ORDER — NEOSTIGMINE METHYLSULFATE 10 MG/10ML IV SOLN
INTRAVENOUS | Status: AC
  Filled 2015-07-21: qty 1

## 2015-07-21 MED ORDER — VANCOMYCIN HCL 1000 MG IV SOLR
INTRAVENOUS | Status: AC
  Filled 2015-07-21: qty 1000

## 2015-07-21 MED ORDER — PRO-STAT SUGAR FREE PO LIQD
30.0000 mL | Freq: Two times a day (BID) | ORAL | Status: DC
  Filled 2015-07-21: qty 30

## 2015-07-21 MED ORDER — PROPOFOL 10 MG/ML IV BOLUS
INTRAVENOUS | Status: DC | PRN
  Administered 2015-07-21: 200 mg via INTRAVENOUS
  Administered 2015-07-21: 50 mg via INTRAVENOUS

## 2015-07-21 MED ORDER — PHENYLEPHRINE HCL 10 MG/ML IJ SOLN
INTRAMUSCULAR | Status: DC | PRN
  Administered 2015-07-21: 80 ug via INTRAVENOUS
  Administered 2015-07-21 (×2): 40 ug via INTRAVENOUS
  Administered 2015-07-21 (×2): 80 ug via INTRAVENOUS

## 2015-07-21 MED ORDER — METOCLOPRAMIDE HCL 5 MG/ML IJ SOLN: 5.0000 mg | Freq: Three times a day (TID) | INTRAMUSCULAR | Status: DC | PRN

## 2015-07-21 MED ORDER — FENTANYL CITRATE (PF) 100 MCG/2ML IJ SOLN
INTRAMUSCULAR | Status: DC | PRN
  Administered 2015-07-21: 50 ug via INTRAVENOUS
  Administered 2015-07-21 (×2): 100 ug via INTRAVENOUS

## 2015-07-21 MED ORDER — ONDANSETRON HCL 4 MG/2ML IJ SOLN
INTRAMUSCULAR | Status: AC
  Filled 2015-07-21: qty 2

## 2015-07-21 MED ORDER — HYDROMORPHONE HCL 1 MG/ML IJ SOLN: 0.5000 mg | INTRAMUSCULAR | Status: DC | PRN

## 2015-07-21 MED ORDER — LIDOCAINE HCL (CARDIAC) 20 MG/ML IV SOLN
INTRAVENOUS | Status: AC
  Filled 2015-07-21: qty 5

## 2015-07-21 MED ORDER — ONDANSETRON HCL 4 MG/2ML IJ SOLN: 4.0000 mg | Freq: Four times a day (QID) | INTRAMUSCULAR | Status: DC | PRN

## 2015-07-21 MED ORDER — BUPIVACAINE HCL (PF) 0.25 % IJ SOLN
INTRAMUSCULAR | Status: AC
  Filled 2015-07-21: qty 30

## 2015-07-21 MED ORDER — HYDROMORPHONE HCL 1 MG/ML IJ SOLN
INTRAMUSCULAR | Status: AC
  Filled 2015-07-21: qty 1

## 2015-07-21 MED ORDER — VANCOMYCIN HCL 500 MG IV SOLR
INTRAVENOUS | Status: DC | PRN
  Administered 2015-07-21: 500 mg

## 2015-07-21 MED ORDER — ROCURONIUM BROMIDE 100 MG/10ML IV SOLN
INTRAVENOUS | Status: DC | PRN
  Administered 2015-07-21: 50 mg via INTRAVENOUS

## 2015-07-21 MED ORDER — ACETAMINOPHEN 325 MG PO TABS: 650.0000 mg | ORAL_TABLET | Freq: Four times a day (QID) | ORAL | Status: DC | PRN

## 2015-07-21 MED ORDER — FENTANYL CITRATE (PF) 250 MCG/5ML IJ SOLN
INTRAMUSCULAR | Status: AC
  Filled 2015-07-21: qty 5

## 2015-07-21 MED ORDER — MIDAZOLAM HCL 5 MG/5ML IJ SOLN
INTRAMUSCULAR | Status: DC | PRN
  Administered 2015-07-21 (×3): 1 mg via INTRAVENOUS

## 2015-07-21 MED ORDER — GENTAMICIN SULFATE 40 MG/ML IJ SOLN
INTRAMUSCULAR | Status: AC
  Filled 2015-07-21: qty 6

## 2015-07-21 MED ORDER — GLYCOPYRROLATE 0.2 MG/ML IJ SOLN
INTRAMUSCULAR | Status: AC
  Filled 2015-07-21: qty 3

## 2015-07-21 MED ORDER — HYDROMORPHONE HCL 1 MG/ML IJ SOLN
0.2500 mg | INTRAMUSCULAR | Status: DC | PRN
  Administered 2015-07-21: 0.5 mg via INTRAVENOUS

## 2015-07-21 MED ORDER — VANCOMYCIN HCL 10 G IV SOLR
2000.0000 mg | Freq: Two times a day (BID) | INTRAVENOUS | Status: DC
  Administered 2015-07-21 (×3): 2000 mg via INTRAVENOUS
  Filled 2015-07-21 (×4): qty 2000

## 2015-07-21 MED ORDER — GLYCOPYRROLATE 0.2 MG/ML IJ SOLN
INTRAMUSCULAR | Status: DC | PRN
  Administered 2015-07-21: 0.6 mg via INTRAVENOUS

## 2015-07-21 MED ORDER — LACTATED RINGERS IV SOLN
INTRAVENOUS | Status: DC | PRN
Start: 2015-07-21 — End: 2015-07-21
  Administered 2015-07-21: 11:00:00 via INTRAVENOUS

## 2015-07-21 MED ORDER — EPINEPHRINE HCL 1 MG/ML IJ SOLN
INTRAMUSCULAR | Status: AC
  Filled 2015-07-21: qty 1

## 2015-07-21 MED ORDER — ACETAMINOPHEN 650 MG RE SUPP: 650.0000 mg | Freq: Four times a day (QID) | RECTAL | Status: DC | PRN

## 2015-07-21 MED ORDER — GENTAMICIN SULFATE 40 MG/ML IJ SOLN
INTRAMUSCULAR | Status: DC | PRN
  Administered 2015-07-21 (×2): 80 mg

## 2015-07-21 MED ORDER — METOCLOPRAMIDE HCL 5 MG PO TABS: 5.0000 mg | ORAL_TABLET | Freq: Three times a day (TID) | ORAL | Status: DC | PRN

## 2015-07-21 MED ORDER — ARTIFICIAL TEARS OP OINT
TOPICAL_OINTMENT | OPHTHALMIC | Status: AC
  Filled 2015-07-21: qty 3.5

## 2015-07-21 SURGICAL SUPPLY — 67 items
BLADE CUTTER GATOR 3.5 (BLADE) IMPLANT
BLADE GREAT WHITE 4.2 (BLADE) IMPLANT
BLADE GREAT WHITE 4.2MM (BLADE)
BLADE SURG 10 STRL SS (BLADE) ×4 IMPLANT
BLADE SURG 11 STRL SS (BLADE) ×4 IMPLANT
BLADE SURG 15 STRL LF DISP TIS (BLADE) ×2 IMPLANT
BLADE SURG 15 STRL SS (BLADE) ×2
BNDG COHESIVE 3X5 TAN STRL LF (GAUZE/BANDAGES/DRESSINGS) ×4 IMPLANT
BUR OVAL 6.0 (BURR) IMPLANT
CANISTER WOUND CARE 500ML ATS (WOUND CARE) ×4 IMPLANT
CANNULA TWIST IN 8.25X7CM (CANNULA) IMPLANT
COVER SURGICAL LIGHT HANDLE (MISCELLANEOUS) ×4 IMPLANT
DECANTER SPIKE VIAL GLASS SM (MISCELLANEOUS) ×4 IMPLANT
DRAPE IMP U-DRAPE 54X76 (DRAPES) ×4 IMPLANT
DRAPE SHOULDER BEACH CHAIR (DRAPES) ×4 IMPLANT
DRAPE U-SHAPE 47X51 STRL (DRAPES) ×4 IMPLANT
DRSG PAD ABDOMINAL 8X10 ST (GAUZE/BANDAGES/DRESSINGS) IMPLANT
DURAPREP 26ML APPLICATOR (WOUND CARE) IMPLANT
ELECT MENISCUS 165MM 90D (ELECTRODE) IMPLANT
ELECT REM PT RETURN 9FT ADLT (ELECTROSURGICAL) ×4
ELECTRODE REM PT RTRN 9FT ADLT (ELECTROSURGICAL) ×2 IMPLANT
FILTER STRAW FLUID ASPIR (MISCELLANEOUS) IMPLANT
GAUZE SPONGE 4X4 12PLY STRL (GAUZE/BANDAGES/DRESSINGS) IMPLANT
GAUZE SPONGE 4X4 16PLY XRAY LF (GAUZE/BANDAGES/DRESSINGS) ×4 IMPLANT
GAUZE XEROFORM 1X8 LF (GAUZE/BANDAGES/DRESSINGS) IMPLANT
GLOVE BIO SURGEON STRL SZ7 (GLOVE) ×4 IMPLANT
GLOVE BIO SURGEON STRL SZ8 (GLOVE) ×4 IMPLANT
GLOVE BIOGEL PI IND STRL 7.0 (GLOVE) ×4 IMPLANT
GLOVE BIOGEL PI IND STRL 7.5 (GLOVE) ×2 IMPLANT
GLOVE BIOGEL PI IND STRL 9 (GLOVE) ×2 IMPLANT
GLOVE BIOGEL PI INDICATOR 7.0 (GLOVE) ×4
GLOVE BIOGEL PI INDICATOR 7.5 (GLOVE) ×2
GLOVE BIOGEL PI INDICATOR 9 (GLOVE) ×2
GLOVE SS BIOGEL STRL SZ 7.5 (GLOVE) ×2 IMPLANT
GLOVE SUPERSENSE BIOGEL SZ 7.5 (GLOVE) ×2
GLOVE SURG ORTHO 9.0 STRL STRW (GLOVE) ×4 IMPLANT
GOWN STRL REUS W/ TWL LRG LVL3 (GOWN DISPOSABLE) ×4 IMPLANT
GOWN STRL REUS W/ TWL XL LVL3 (GOWN DISPOSABLE) ×6 IMPLANT
GOWN STRL REUS W/TWL LRG LVL3 (GOWN DISPOSABLE) ×4
GOWN STRL REUS W/TWL XL LVL3 (GOWN DISPOSABLE) ×6
KIT PREVENA INCISION MGT 13 (CANNISTER) ×4 IMPLANT
KIT ROOM TURNOVER OR (KITS) ×4 IMPLANT
KIT STIMULAN RAPID CURE 5CC (Orthopedic Implant) ×4 IMPLANT
NEEDLE 18GX1X1/2 (RX/OR ONLY) (NEEDLE) ×4 IMPLANT
NEEDLE HYPO 25GX1X1/2 BEV (NEEDLE) ×8 IMPLANT
NEEDLE SPNL 18GX3.5 QUINCKE PK (NEEDLE) ×4 IMPLANT
PACK ARTHROSCOPY DSU (CUSTOM PROCEDURE TRAY) ×4 IMPLANT
PACK UNIVERSAL I (CUSTOM PROCEDURE TRAY) ×4 IMPLANT
PAD ARMBOARD 7.5X6 YLW CONV (MISCELLANEOUS) ×8 IMPLANT
PENCIL BUTTON HOLSTER BLD 10FT (ELECTRODE) ×4 IMPLANT
RESTRAINT HEAD UNIVERSAL NS (MISCELLANEOUS) ×4 IMPLANT
SET ARTHROSCOPY TUBING (MISCELLANEOUS) ×2
SET ARTHROSCOPY TUBING LN (MISCELLANEOUS) ×2 IMPLANT
SLING ARM IMMOBILIZER LRG (SOFTGOODS) ×4 IMPLANT
SLING ARM IMMOBILIZER MED (SOFTGOODS) IMPLANT
SUT ETHILON 2 0 FS 18 (SUTURE) ×8 IMPLANT
SUT ETHILON 3 0 PS 1 (SUTURE) ×4 IMPLANT
SUT VIC AB 2-0 CT1 27 (SUTURE) ×2
SUT VIC AB 2-0 CT1 TAPERPNT 27 (SUTURE) ×2 IMPLANT
SYR 3ML LL SCALE MARK (SYRINGE) ×4 IMPLANT
SYR CONTROL 10ML LL (SYRINGE) ×4 IMPLANT
TAPE STRIPS DRAPE STRL (GAUZE/BANDAGES/DRESSINGS) ×4 IMPLANT
TOWEL OR 17X24 6PK STRL BLUE (TOWEL DISPOSABLE) ×4 IMPLANT
TOWEL OR 17X26 10 PK STRL BLUE (TOWEL DISPOSABLE) ×4 IMPLANT
TUBE CONNECTING 12'X1/4 (SUCTIONS) ×1
TUBE CONNECTING 12X1/4 (SUCTIONS) ×3 IMPLANT
WAND HAND CNTRL MULTIVAC 90 (MISCELLANEOUS) ×4 IMPLANT

## 2015-07-21 NOTE — Progress Notes (Signed)
Pharmacy Antibiotic Follow-up Note  Jerry Carey is a 45 y.o. year-old male admitted on 07/15/2015 for L shoulder abcess s/p multiple I&Ds, wound VAC in place, on empiric vancomycin.  Vancomycin trough tonight slightly supratherapeutic.  Goal of Therapy: Vancomycin trough 15-20 mcg/mL  Assessment/Plan: Adjust Vancomycin to 2000 mg IV q12h Will continue to follow renal function, culture results, LOT, and antibiotic de-escalation plans   Temp (24hrs), Avg:98.7 F (37.1 C), Min:98.1 F (36.7 C), Max:99.3 F (37.4 C)   Recent Labs Lab 07/16/15 0550 07/17/15 0419 07/18/15 1429 07/19/15 0630 07/20/15 0942  WBC 8.5 7.4 10.6* 8.3 9.6     Recent Labs Lab 07/14/15 1124 07/16/15 0550 07/17/15 0419 07/18/15 1429 07/19/15 0630  CREATININE 0.99 1.03 0.89 0.92 1.01   Estimated Creatinine Clearance: 102.2 mL/min (by C-G formula based on Cr of 1.01).    Recent Labs     07/19/15  0815  07/20/15  2300  VANCOTROUGH  13  23*    Allergies  Allergen Reactions  . Chlorhexidine Gluconate Rash    Patient states he had severe burning and rash and was given benadryl    Antimicrobials this admission: Doxy/LVQ PTA Vancomycin 1/19 >> Cefazolin 1/19 >>  Microbiology results: Outpatient cx >> CoNS (sens unknown)  Geannie Risen, PharmD, BCPS   07/21/2015 1:33 AM

## 2015-07-21 NOTE — Progress Notes (Addendum)
Nutrition Follow-up  DOCUMENTATION CODES:   Not applicable  INTERVENTION:  Once diet advances, provide 30 ml Prostat po BID, each supplement provides 100 kcal and 15 grams of protein.   Encourage adequate PO intake.   NUTRITION DIAGNOSIS:   Increased nutrient needs related to wound healing as evidenced by estimated needs; ongoing  GOAL:   Patient will meet greater than or equal to 90% of their needs; met  MONITOR:   PO intake, Supplement acceptance, Weight trends, Labs, I & O's, Skin  REASON FOR ASSESSMENT:   Malnutrition Screening Tool    ASSESSMENT:   45 y.o male Tore his left pec and had repair on 06/03/15 Wound dehissed on 06/24/2015 underwent I and D.Wound started draining last Thursday. Presents with left shoulder wound drainage and staph infection.  Plan for surgery today. Prior to NPO status, meal completion has been 100%. RD to continue with Prostat that has been ordered to aid in wound healing.   Diet Order:  Diet NPO time specified  Skin:  Wound (see comment) (Incision on L should with wound VAC)  Last BM:  1/24  Height:   Ht Readings from Last 1 Encounters:  07/15/15 6' 1"  (1.854 m)    Weight:   Wt Readings from Last 1 Encounters:  07/15/15 204 lb (92.534 kg)    Ideal Body Weight:  83.6 kg  BMI:  Body mass index is 26.92 kg/(m^2).  Estimated Nutritional Needs:   Kcal:  6742-5525  Protein:  110-125 grams  Fluid:  2.3 - 2.5 L/day  EDUCATION NEEDS:   Education needs addressed  Corrin Parker, MS, RD, LDN Pager # [phone] After hours/ weekend pager # [phone]

## 2015-07-21 NOTE — Op Note (Signed)
Jerry Carey, DALSANTO           ACCOUNT NO.:  [id number]  MEDICAL RECORD NO.:  [id number]  LOCATION:  5N27C                        FACILITY:  [facility]  PHYSICIAN:  Elana Alm. Thurston Hole, M.D. DATE OF BIRTH:  March 11, 1971  DATE OF PROCEDURE:  07/21/2015 DATE OF DISCHARGE:                              OPERATIVE REPORT   PREOPERATIVE DIAGNOSES: 1. Left shoulder wound infection, post pectoralis major repair. 2. Left shoulder wound dehiscence.  POSTOPERATIVE DIAGNOSIS: 1. Left shoulder wound infection, post pectoralis major repair. 2. Left shoulder wound dehiscence.  PROCEDURE: 1. Left shoulder irrigation and debridement. 2. Left shoulder antibiotic bead implantation with application of     wound VAC - Prevena.  SURGEON:  Elana Alm. Thurston Hole, M.D.  ASSISTANTS:  Nadara Mustard, M.D. and [address], PA-C.  ANESTHESIA:  General.  OPERATIVE TIME:  45 minutes.  COMPLICATIONS:  None.  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  He has now been brought back to the operating room today for his third irrigation and debridement, possible wound closure and wound VAC application.  DESCRIPTION OF PROCEDURE:  [name] was brought in to the operating room on July 21, 2015, placed on the operative table in supine position.  He was already on IV antibiotics on scheduled doses. After being placed under general anesthesia, he was placed in beach- chair position.  His left shoulder was examined.  He had near full range of motion with a persistent open dehisced wound of approximately 3 cm with healing of the more superior and inferior aspects of the previous surgical wound.  A previous wound VAC that had been in place was removed.  The left shoulder and arm was prepped using sterile  DuraPrep and draped using sterile technique.  Time-out procedure was called and the correct left shoulder identified.  On deep exploration of the wound, there was found to be excellent granulation tissue forming.  Further irrigation and debridement was achieved with 3 L of saline solution. After this was done, it was felt in order to decrease the dead space in the depths of the wound that antibiotic beads were placed.  5 mL of Stimulan antibiotic beads were placed in the depths of the wound, which was clean.  After this was done, then the deltopectoral interval was closed over these antibiotic beads with 2-0 Vicryl suture.  At this point, the rest of the incision could be of the previous surgical incision where the wound dehiscence was could be closed and multiple interrupted 2-0 nylon mattress sutures were placed with excellent skin edge approximation.  After this was done, then Prevena wound VAC was placed on top of this and sealed.  At this point, it was felt that the procedure had been satisfactorily completed.  The patient was then awakened and taken to recovery in stable condition.  Needle and sponge counts correct x2 at the end the case.     Bonner Larue A. Thurston Hole, M.D.     RAW/MEDQ  D:  07/21/2015  T:  07/21/2015  Job:  [id number]

## 2015-07-21 NOTE — Anesthesia Postprocedure Evaluation (Signed)
Anesthesia Post Note  Patient: Jerry Carey  Procedure(s) Performed: Procedure(s) (LRB): IRRIGATION AND DEBRIDEMENT EXTREMITY; LEFT SHOULDER (Left)  Patient location during evaluation: PACU Anesthesia Type: General Level of consciousness: awake Pain management: pain level controlled Vital Signs Assessment: post-procedure vital signs reviewed and stable Respiratory status: spontaneous breathing Cardiovascular status: stable Anesthetic complications: no    Last Vitals:  Filed Vitals:   07/21/15 1315 07/21/15 1330  BP: 108/56 117/66  Pulse: 63 58  Temp:    Resp: 11 10    Last Pain:  Filed Vitals:   07/21/15 1416  PainSc: 7                  EDWARDS,[name]

## 2015-07-21 NOTE — Anesthesia Preprocedure Evaluation (Addendum)
Anesthesia Evaluation  Patient identified by MRN, date of birth, ID band Patient awake    Reviewed: Allergy & Precautions, NPO status , Patient's Chart, lab work & pertinent test results  History of Anesthesia Complications (+) history of anesthetic complications  Airway Mallampati: II  TM Distance: >3 FB Neck ROM: Full    Dental  (+) Teeth Intact, Dental Advisory Given   Pulmonary former smoker,    Pulmonary exam normal breath sounds clear to auscultation       Cardiovascular Exercise Tolerance: Good negative cardio ROS Normal cardiovascular exam Rhythm:Regular Rate:Normal     Neuro/Psych negative neurological ROS  negative psych ROS   GI/Hepatic Neg liver ROS, GERD  Medicated,  Endo/Other  negative endocrine ROS  Renal/GU negative Renal ROS     Musculoskeletal negative musculoskeletal ROS (+)   Abdominal   Peds  Hematology negative hematology ROS (+)   Anesthesia Other Findings Day of surgery medications reviewed with the patient.  Reproductive/Obstetrics                           Anesthesia Physical Anesthesia Plan  ASA: II  Anesthesia Plan: General   Post-op Pain Management:    Induction: Intravenous  Airway Management Planned: Oral ETT  Additional Equipment:   Intra-op Plan:   Post-operative Plan:   Informed Consent: I have reviewed the patients History and Physical, chart, labs and discussed the procedure including the risks, benefits and alternatives for the proposed anesthesia with the patient or authorized representative who has indicated his/her understanding and acceptance.   Dental advisory given  Plan Discussed with: CRNA and Surgeon  Anesthesia Plan Comments: (Plan routine monitors, GA)        Anesthesia Quick Evaluation

## 2015-07-21 NOTE — Progress Notes (Signed)
   07/21/15 1030  Clinical Encounter Type  Visited With Patient  Visit Type Follow-up;Pre-op;Spiritual support;Social support  Referral From Patient  Spiritual Encounters  Spiritual Needs Prayer;Emotional  Stress Factors  Patient Stress Factors Exhausted;Financial concerns;Family relationships;Loss of control   Chaplain followed up with patient before surgery. Chaplain reviewed sacred text, offered support and prayer. Chaplain support available as needed.   Alda Ponder, Chaplain 07/21/2015 11:20 AM

## 2015-07-21 NOTE — Transfer of Care (Signed)
Immediate Anesthesia Transfer of Care Note  Patient: Jerry Carey  Procedure(s) Performed: Procedure(s): IRRIGATION AND DEBRIDEMENT EXTREMITY; LEFT SHOULDER (Left)  Patient Location: PACU  Anesthesia Type:General  Level of Consciousness: sedated and responds to stimulation  Airway & Oxygen Therapy: Patient Spontanous Breathing and Patient connected to face mask oxygen  Post-op Assessment: Report given to RN and Post -op Vital signs reviewed and stable  Post vital signs: Reviewed and stable  Last Vitals:  Filed Vitals:   07/20/15 1408 07/21/15 0534  BP: 158/71 119/56  Pulse: 109 61  Temp: 37.4 C 36.7 C  Resp: 18 18    Complications: No apparent anesthesia complications

## 2015-07-21 NOTE — Interval H&P Note (Signed)
History and Physical Interval Note:  07/21/2015 6:20 AM  Jerry Carey  has presented today for surgery, with the diagnosis of infected shoulder  The various methods of treatment have been discussed with the patient and family. After consideration of risks, benefits and other options for treatment, the patient has consented to   as a surgical intervention .  The patient's history has been reviewed, patient examined, no change in status, stable for surgery. I& D left shoulder .  I have reviewed the patient's chart and labs.  Questions were answered to the patient's satisfaction.     Salvatore Marvel A

## 2015-07-21 NOTE — Progress Notes (Signed)
CHG bath preop not done due to patient's allergy to Chlorhexidine Gluconate.

## 2015-07-21 NOTE — Anesthesia Procedure Notes (Signed)
Procedure Name: Intubation Date/Time: 07/21/2015 11:49 AM Performed by: Marni Griffon Pre-anesthesia Checklist: Patient identified, Emergency Drugs available, Suction available and Patient being monitored Patient Re-evaluated:Patient Re-evaluated prior to inductionOxygen Delivery Method: Circle system utilized Preoxygenation: Pre-oxygenation with 100% oxygen Intubation Type: IV induction Ventilation: Mask ventilation without difficulty Laryngoscope Size: Mac and 4 Tube type: Oral Tube size: 7.5 mm Number of attempts: 1 Airway Equipment and Method: Stylet Placement Confirmation: ETT inserted through vocal cords under direct vision,  breath sounds checked- equal and bilateral and positive ETCO2 Secured at: 22 (cm at teeth) cm Tube secured with: Tape Dental Injury: Teeth and Oropharynx as per pre-operative assessment

## 2015-07-21 NOTE — Progress Notes (Signed)
Pt arrived to pacu with oral airway in place.

## 2015-07-22 ENCOUNTER — Encounter (HOSPITAL_COMMUNITY): Payer: Self-pay | Admitting: Orthopedic Surgery

## 2015-07-22 LAB — CBC
HEMATOCRIT: 43.9 % (ref 39.0–52.0)
HEMOGLOBIN: 15.2 g/dL (ref 13.0–17.0)
MCH: 31.5 pg (ref 26.0–34.0)
MCHC: 34.6 g/dL (ref 30.0–36.0)
MCV: 90.9 fL (ref 78.0–100.0)
Platelets: 264 10*3/uL (ref 150–400)
RBC: 4.83 MIL/uL (ref 4.22–5.81)
RDW: 12.5 % (ref 11.5–15.5)
WBC: 6.5 10*3/uL (ref 4.0–10.5)

## 2015-07-22 LAB — COMPREHENSIVE METABOLIC PANEL
ALBUMIN: 3.5 g/dL (ref 3.5–5.0)
ALK PHOS: 57 U/L (ref 38–126)
ALT: 30 U/L (ref 17–63)
ANION GAP: 6 (ref 5–15)
AST: 30 U/L (ref 15–41)
BUN: 10 mg/dL (ref 6–20)
CALCIUM: 9.2 mg/dL (ref 8.9–10.3)
CO2: 25 mmol/L (ref 22–32)
Chloride: 108 mmol/L (ref 101–111)
Creatinine, Ser: 1.02 mg/dL (ref 0.61–1.24)
GFR calc non Af Amer: >60 mL/min (ref >60)
GLUCOSE: 100 mg/dL — AB (ref 65–99)
POTASSIUM: 4.2 mmol/L (ref 3.5–5.1)
SODIUM: 139 mmol/L (ref 135–145)
Total Bilirubin: 0.6 mg/dL (ref 0.3–1.2)
Total Protein: 6.4 g/dL — ABNORMAL LOW (ref 6.5–8.1)

## 2015-07-22 MED ORDER — CYCLOBENZAPRINE HCL 10 MG PO TABS: 10.0000 mg | ORAL_TABLET | Freq: Three times a day (TID) | ORAL | Status: DC | PRN

## 2015-07-22 MED ORDER — ALPRAZOLAM 0.5 MG PO TABS: 0.5000 mg | ORAL_TABLET | Freq: Three times a day (TID) | ORAL | Status: DC | PRN

## 2015-07-22 NOTE — Discharge Summary (Signed)
Patient ID: Jerry Carey MRN: [id number] DOB/AGE: 08/17/1970 45 y.o.  Admit date: 07/15/2015 Discharge date: 07/22/2015  Admission Diagnoses:  Active Problems:   GERD (gastroesophageal reflux disease)   Pectoralis muscle rupture   Abscess of left shoulder   Wound infection following procedure   Coagulase-negative staphylococcal infection   Discharge Diagnoses:  Same  Past Medical History  Diagnosis Date  . GERD (gastroesophageal reflux disease)   . Pectoralis muscle rupture     left  . Abscess of left shoulder 06/24/2015  . Coagulase-negative staphylococcal infection 07/12/2015    left shoulder wound  . Complication of anesthesia     woke up angry and aggitated  . Infection     left shoulder wound  . Pneumonia   . Wears glasses     Surgeries: Procedure(s): IRRIGATION AND DEBRIDEMENT EXTREMITY; LEFT SHOULDER on 07/15/2015 - 07/21/2015   Consultants:    Discharged Condition: Improved  Hospital Course: [place] Dani is an 45 y.o. male who was admitted 07/15/2015 for operative treatment of<principal problem not specified>. Patient has severe unremitting pain that affects sleep, daily activities, and work/hobbies. After pre-op clearance the patient was taken to the operating room on 07/15/2015 - 07/21/2015 and underwent  Procedure(s): IRRIGATION AND DEBRIDEMENT EXTREMITY; LEFT SHOULDER.    Patient was given perioperative antibiotics: Anti-infectives    Start     Dose/Rate Route Frequency Ordered Stop   07/21/15 1241  gentamicin (GARAMYCIN) injection  Status:  Discontinued       As needed 07/21/15 1406 07/21/15 1408   07/21/15 1215  vancomycin (VANCOCIN) powder  Status:  Discontinued       As needed 07/21/15 1404 07/21/15 1408   07/21/15 1000  vancomycin (VANCOCIN) 2,000 mg in sodium chloride 0.9 % 500 mL IVPB     2,000 mg 250 mL/hr over 120 Minutes Intravenous Every 12 hours 07/21/15 0141     07/19/15 1500  vancomycin (VANCOCIN) 1,500 mg in sodium chloride 0.9 % 500  mL IVPB  Status:  Discontinued     1,500 mg 250 mL/hr over 120 Minutes Intravenous Every 8 hours 07/19/15 1026 07/21/15 0141   07/17/15 1600  vancomycin (VANCOCIN) 1,250 mg in sodium chloride 0.9 % 250 mL IVPB  Status:  Discontinued     1,250 mg 166.7 mL/hr over 90 Minutes Intravenous Every 8 hours 07/17/15 1304 07/19/15 1026   07/15/15 2200  vancomycin (VANCOCIN) 1,750 mg in sodium chloride 0.9 % 500 mL IVPB  Status:  Discontinued     1,750 mg 250 mL/hr over 120 Minutes Intravenous Every 8 hours 07/15/15 1204 07/15/15 1508   07/15/15 1600  vancomycin (VANCOCIN) IVPB 1000 mg/200 mL premix  Status:  Discontinued     1,000 mg 200 mL/hr over 60 Minutes Intravenous Every 8 hours 07/15/15 1510 07/17/15 1304   07/15/15 1500  ceFAZolin (ANCEF) IVPB 2 g/50 mL premix  Status:  Discontinued     2 g 100 mL/hr over 30 Minutes Intravenous Every 8 hours 07/15/15 1107 07/17/15 1618   07/15/15 1215  vancomycin (VANCOCIN) IVPB 1000 mg/200 mL premix  Status:  Discontinued     1,000 mg 200 mL/hr over 60 Minutes Intravenous Every 12 hours 07/15/15 1204 07/15/15 1508   07/15/15 0602  ceFAZolin (ANCEF) IVPB 2 g/50 mL premix  Status:  Discontinued     2 g 100 mL/hr over 30 Minutes Intravenous On call to O.R. 07/15/15 0602 07/15/15 1147   07/15/15 0602  vancomycin (VANCOCIN) 1,500 mg in sodium chloride 0.9 % 500 mL IVPB  1,500 mg 250 mL/hr over 120 Minutes Intravenous On call to O.R. 07/15/15 0602 07/15/15 0656   07/15/15 0517  ceFAZolin (ANCEF) 2-3 GM-% IVPB SOLR    Comments:  Scronce, Trina   : cabinet override      07/15/15 0517 07/15/15 0737       Patient was given sequential compression devices, early ambulation, and chemoprophylaxis to prevent DVT.  He had 3 I and D procedures during his hospital stay with the final one resulting in the abililty to close the wound with an incision vac over it.    Patient benefited maximally from hospital stay and there were no complications.    Recent vital signs:  Patient Vitals for the past 24 hrs:  BP Temp Temp src Pulse Resp SpO2  07/22/15 0558 131/62 mmHg 98.2 F (36.8 C) Oral 60 18 96 %  07/22/15 0115 139/60 mmHg 98.1 F (36.7 C) Oral (!) 57 16 97 %  07/21/15 2040 (!) 142/82 mmHg 99.1 F (37.3 C) Oral (!) 59 16 100 %  07/21/15 1519 122/67 mmHg 97.6 F (36.4 C) Axillary (!) 56 16 100 %  07/21/15 1430 - 97.5 F (36.4 C) - (!) 56 (!) 8 99 %  07/21/15 1420 (!) 110/55 mmHg - - (!) 55 10 99 %  07/21/15 1415 - - - (!) 56 12 100 %  07/21/15 1405 (!) 111/57 mmHg - - (!) 56 (!) 9 100 %  07/21/15 1400 - - - (!) 58 10 100 %  07/21/15 1350 110/62 mmHg - - (!) 59 (!) 9 100 %  07/21/15 1345 - - - (!) 58 (!) 9 100 %  07/21/15 1335 117/66 mmHg - - 75 13 96 %  07/21/15 1330 117/66 mmHg - - (!) 58 10 100 %  07/21/15 1320 (!) 108/56 mmHg - - (!) 59 10 99 %  07/21/15 1315 (!) 108/56 mmHg - - 63 11 100 %  07/21/15 1306 - - - 63 11 100 %  07/21/15 1305 (!) 111/50 mmHg - - - - -  07/21/15 1300 (!) 111/50 mmHg 97.5 F (36.4 C) - 62 12 100 %     Recent laboratory studies:  Recent Labs  07/20/15 0942 07/21/15 0503  WBC 9.6 6.3  HGB 16.3 14.4  HCT 48.2 41.9  PLT 333 286     Discharge Medications:     Medication List    STOP taking these medications        doxycycline 100 MG tablet  Commonly known as:  VIBRA-TABS     levofloxacin 750 MG tablet  Commonly known as:  LEVAQUIN     oxyCODONE-acetaminophen 5-325 MG tablet  Commonly known as:  ROXICET     oxymetazoline 0.05 % nasal spray  Commonly known as:  AFRIN      TAKE these medications        acetaminophen 325 MG tablet  Commonly known as:  TYLENOL  Take 2 tablets (650 mg total) by mouth every 6 (six) hours as needed for mild pain (or Fever >/= 101).     ALPRAZolam 0.5 MG tablet  Commonly known as:  XANAX  Take 1-2 tablets (0.5-1 mg total) by mouth 3 (three) times daily as needed for anxiety or sleep.     cyclobenzaprine 10 MG tablet  Commonly known as:  FLEXERIL  Take 1 tablet  (10 mg total) by mouth 3 (three) times daily as needed for muscle spasms.     loratadine 10 MG tablet  Commonly known  as:  CLARITIN  Take 10 mg by mouth daily as needed for allergies.     multivitamin with minerals Tabs tablet  Take 1 tablet by mouth daily.     PROBIOTIC DAILY PO  Take 1 capsule by mouth daily.     ranitidine 150 MG tablet  Commonly known as:  ZANTAC  Take 150 mg by mouth 2 (two) times daily as needed for heartburn.        Diagnostic Studies: No results found.  Disposition: 01-Home or Self Care      Discharge Instructions    Diet - low sodium heart healthy    Complete by:  As directed      Discharge instructions    Complete by:  As directed   Left shoulder wound covered with vac.  Do not get wet.  Wear sling to limit mobility of the wound.  Call [phone] if wound vac starts to beep.     Increase activity slowly    Complete by:  As directed            Follow-up Information    Follow up with Nilda Simmer, MD On 07/28/2015.   Specialty:  Orthopedic Surgery   Why:  appt time 4 pm   Contact information:   [address] [place] [address] [phone]        Signed: Pascal Lux 07/22/2015, 8:25 AM

## 2015-07-22 NOTE — Progress Notes (Signed)
Pt alert and orientated x4 all ready for discharge, portable would vac attached and explained on how to [place], no complaints at this time. Jerry Carey

## 2016-12-07 IMAGING — DX DG WRIST COMPLETE 3+V*R*
4 series · 4 of 4 positions shown · non-contrast
Comparison: None.

CLINICAL DATA: Right hand and wrist pain after injury 2 days prior.
Swelling.

EXAM:
RIGHT WRIST - COMPLETE 3+ VIEW

[wrist pa]
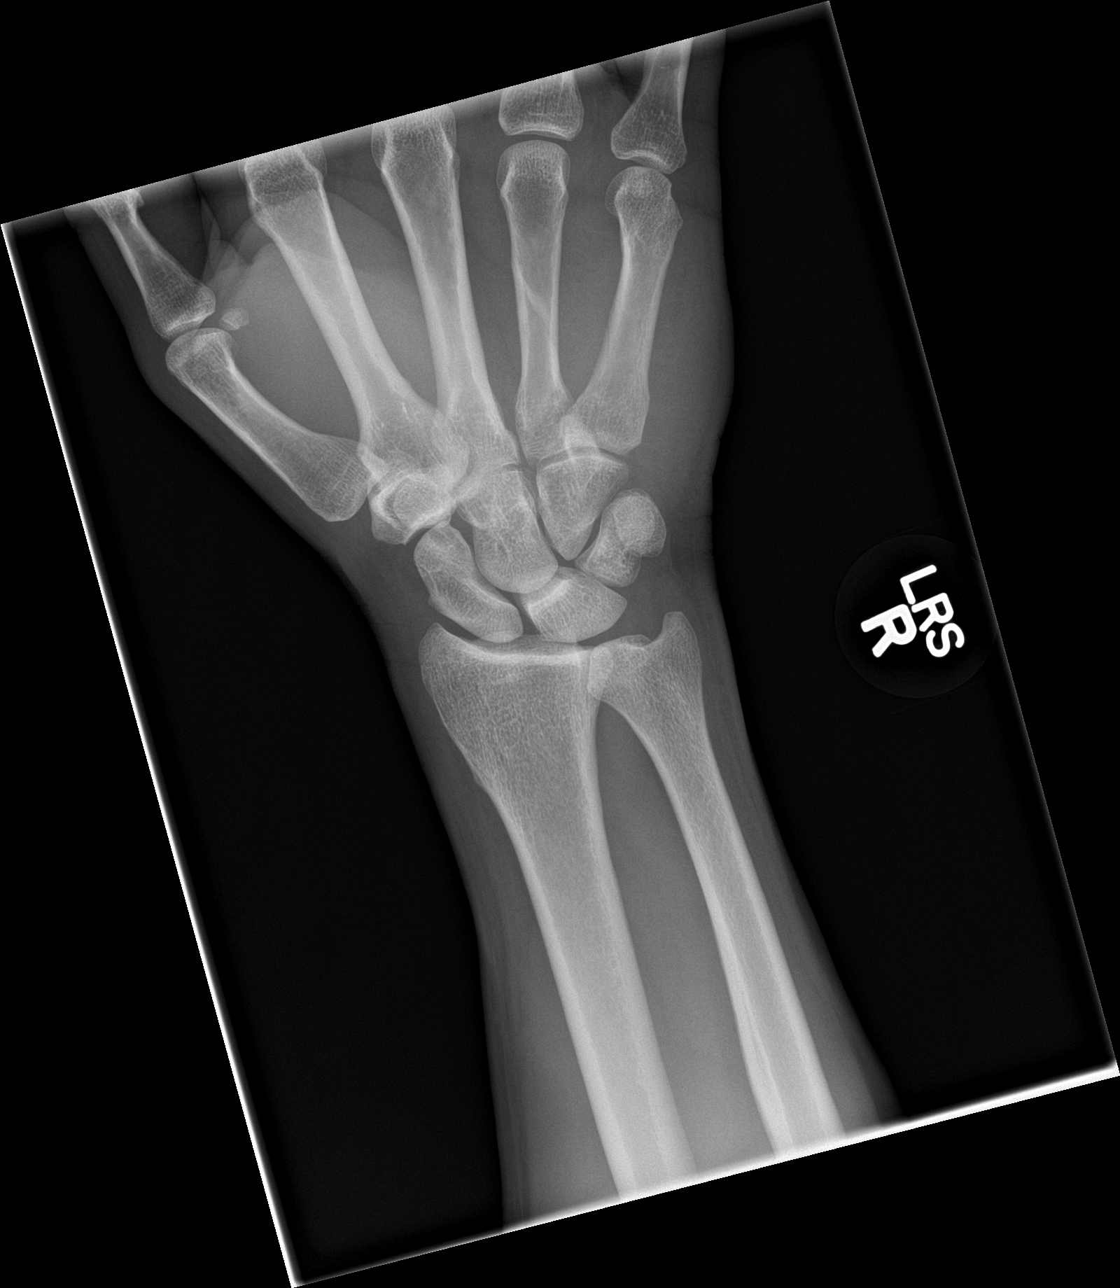

[wrist obl]
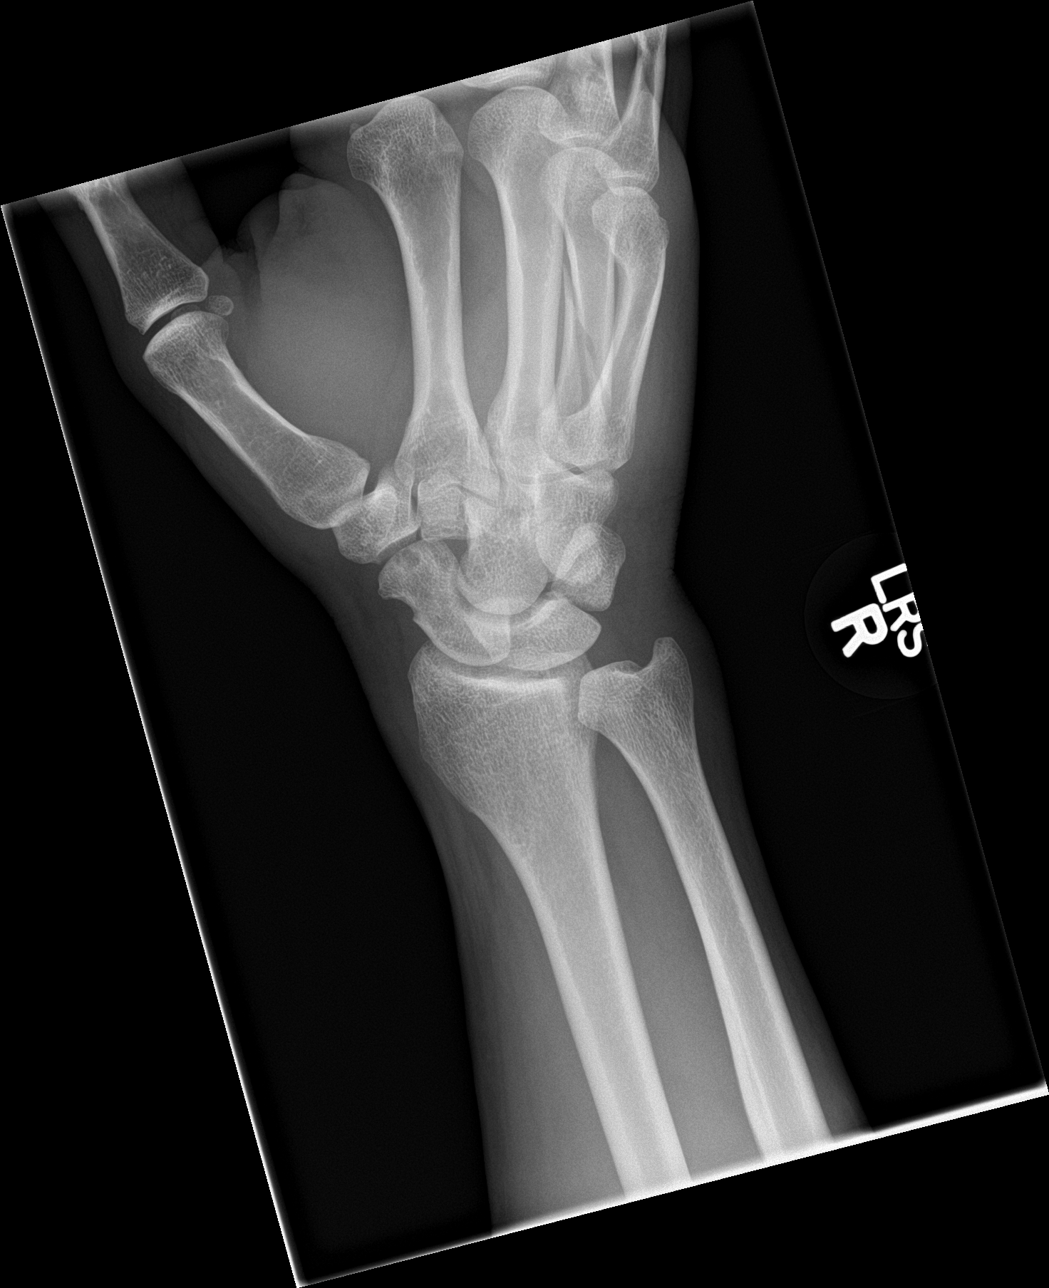

[wrist lat]
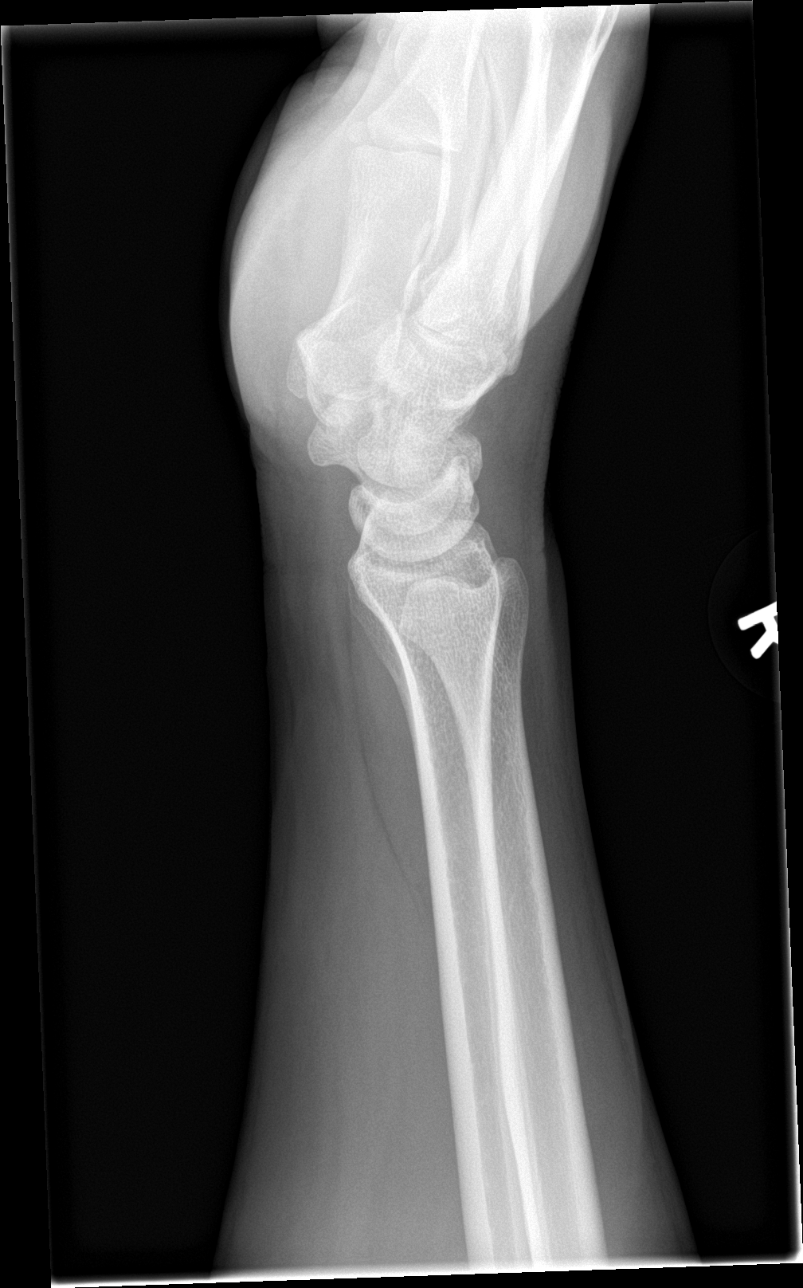

[wrist navicular]
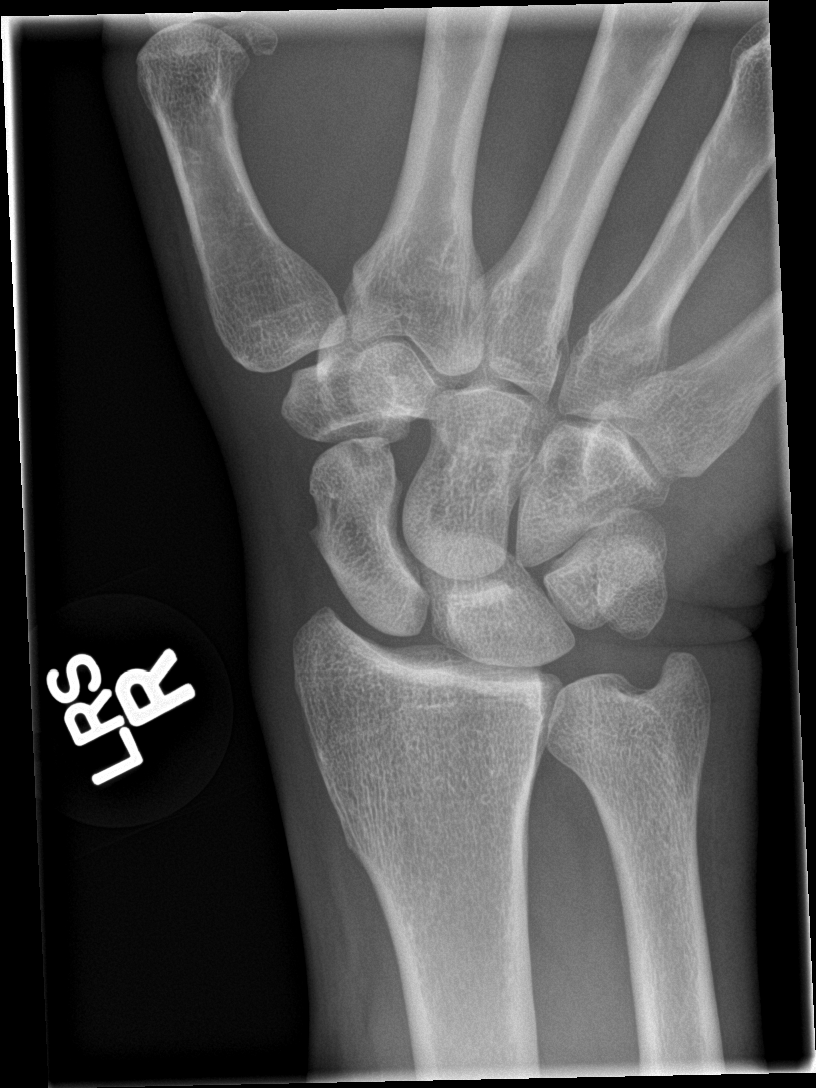

[4 of 4 positions shown; findings below may reference images not displayed]

FINDINGS: Fracture of the fourth metacarpal. No addition fracture or
dislocation of the wrist. The wrist alignment and joint spaces are
maintained. The scaphoid is intact with cystic changes distally.
IMPRESSION: Fourth metacarpal fracture. No additional fracture or dislocation of
the wrist.

## 2017-04-11 IMAGING — MR MR SHOULDER*L* W/O CM
5 series · 40 of 40 positions shown · non-contrast
Comparison: Radiographs 05/25/2015

CLINICAL DATA: Left shoulder injury while lifting weights. Known
pectoralis injury.

EXAM:
MRI OF THE LEFT SHOULDER WITHOUT CONTRAST
TECHNIQUE: Multiplanar, multisequence MR imaging of the shoulder was performed.
No intravenous contrast was administered.

[Series 9: T2 fat-sat · axial · 4.0mm · 0.66mm/px · z∈[-15,+80]mm · 8 of 20 slices shown (1 of 3)]
[im 1/20]
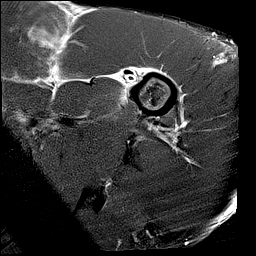
[im 3/20]
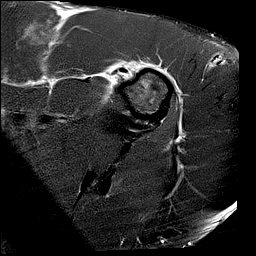
[im 6/20]
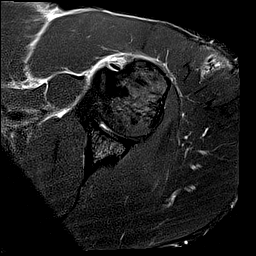
[im 9/20]
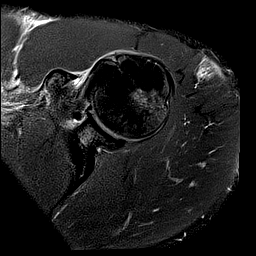
[im 11/20]
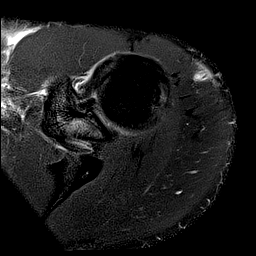
[im 14/20]
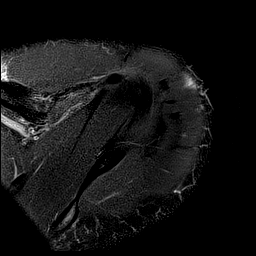
[im 17/20]
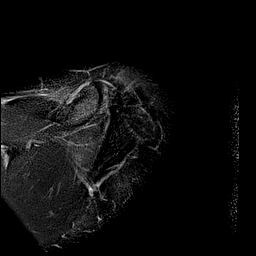
[im 20/20]
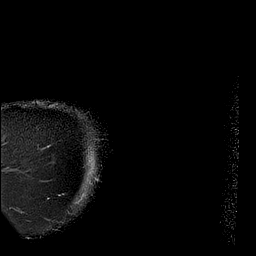

[Series 10: T2 fat-sat · sagittal · 4.0mm · 0.62mm/px · 8 of 22 slices shown (2 of 3)]
[im 1/22]
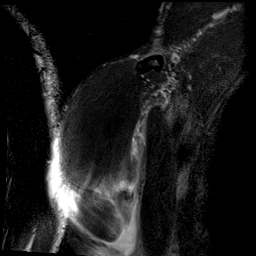
[im 4/22]
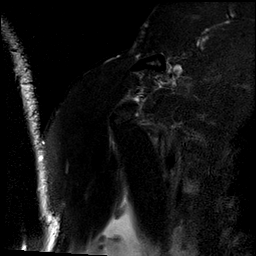
[im 7/22]
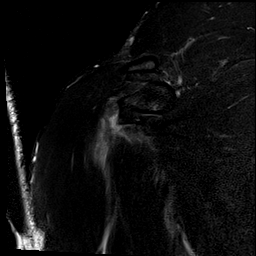
[im 10/22]
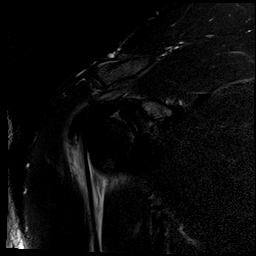
[im 13/22]
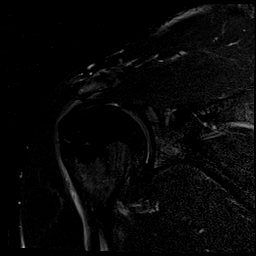
[im 16/22]
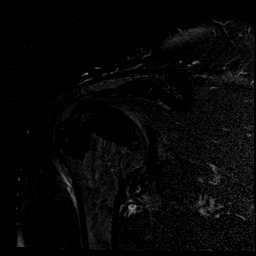
[im 19/22]
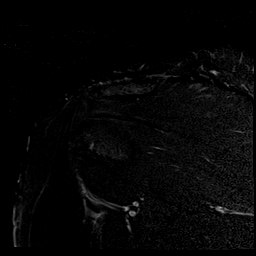
[im 22/22]
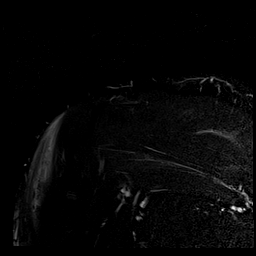

[Series 11: PD fat-sat · sagittal · 4.0mm · 0.62mm/px · 8 of 22 slices shown]
[im 1/22]
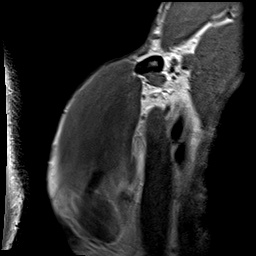
[im 4/22]
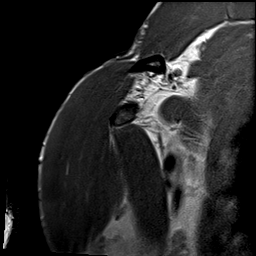
[im 7/22]
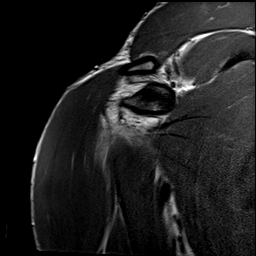
[im 10/22]
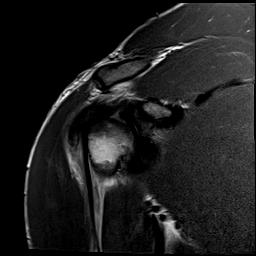
[im 13/22]
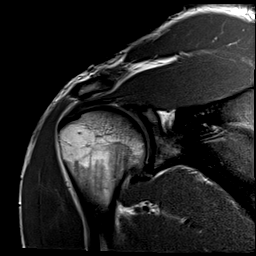
[im 16/22]
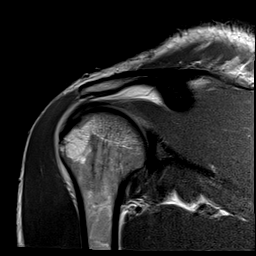
[im 19/22]
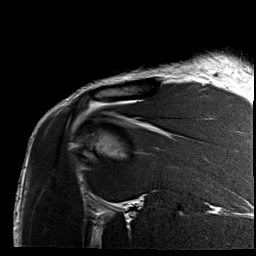
[im 22/22]
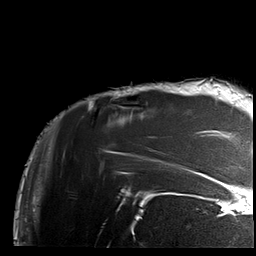

[Series 12: T2 fat-sat · coronal · 4.0mm · 0.66mm/px · 8 of 20 slices shown (3 of 3)]
[im 1/20]
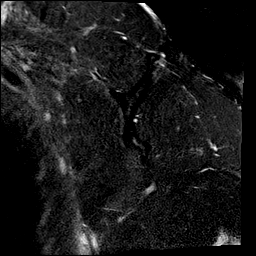
[im 3/20]
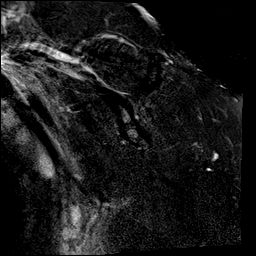
[im 6/20]
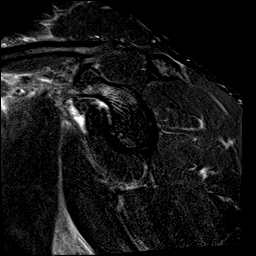
[im 9/20]
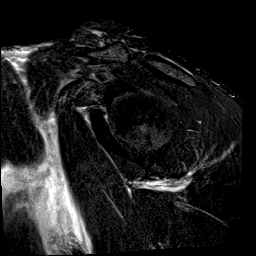
[im 11/20]
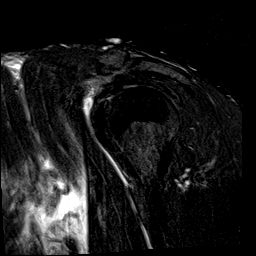
[im 14/20]
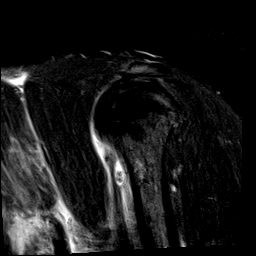
[im 17/20]
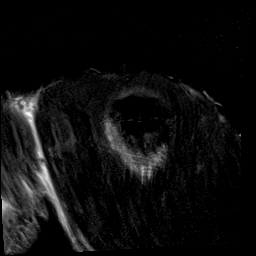
[im 20/20]
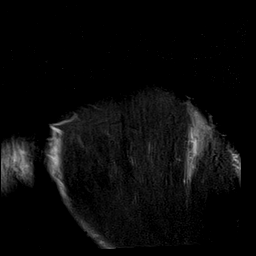

[Series 13: T1 · coronal · 4.0mm · 0.66mm/px · 8 of 20 slices shown]
[im 1/20]
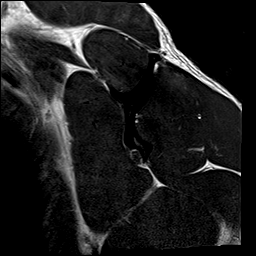
[im 3/20]
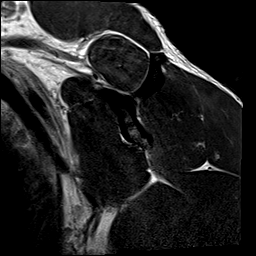
[im 6/20]
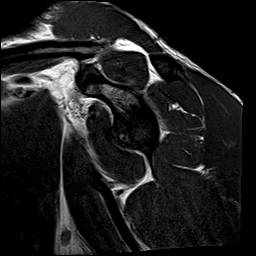
[im 9/20]
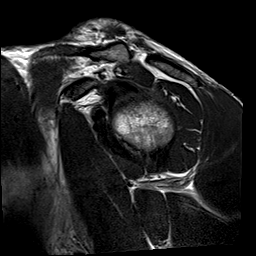
[im 11/20]
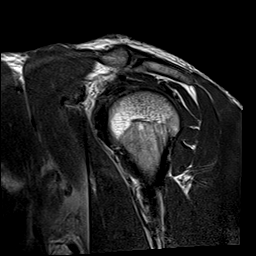
[im 14/20]
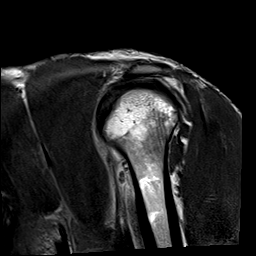
[im 17/20]
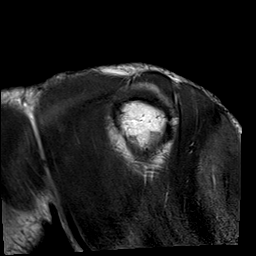
[im 20/20]
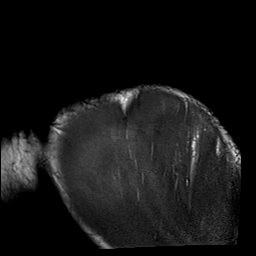

[40 of 40 positions shown; findings below may reference images not displayed]

FINDINGS: Rotator cuff: Mild rotator cuff tendinopathy/ tendinosis. No partial
or full thickness rotator cuff tear.

Muscles: The rotator cuff muscles are normal. Extensive edema like
signal abnormality in the visualized portion of the pectoralis major
muscle. Suspect complete rupture of the pectoralis tendon. Dedicated
pectoralis study suggested for further evaluation.

There is a small tear involving the lateral deltoid muscle.

Biceps long head: Intact. Moderate fluid around the biceps tendon
along with a small calcification.

Acromioclavicular Joint: Intact. Mild degenerative changes. The
acromion is type 2 in shape. Minimal lateral downsloping. No
undersurface spurring.

Glenohumeral Joint: No significant degenerative changes or joint
effusion. Mild synovitis.

Labrum: Degenerative fraying type changes involving the superior and
anterior labrum. No obvious tear.

Bones:  No acute bony findings.
IMPRESSION: 1. Findings suspicious for a pectoralis tendon rupture. Dedicated
pectoralis study would be helpful if clinically necessary.
2. Small lateral deltoid muscle tear.
3. Intact rotator cuff tendons and long head biceps tendon.
4. Superior and anterior labral degenerative changes without obvious
tear.
5. No significant findings for bony impingement.

## 2018-06-26 DIAGNOSIS — I4729 Other ventricular tachycardia: Secondary | ICD-10-CM

## 2018-06-26 HISTORY — DX: Other ventricular tachycardia: I47.29

## 2018-08-03 ENCOUNTER — Emergency Department (HOSPITAL_COMMUNITY)
Admission: EM | Admit: 2018-08-03 | Discharge: 2018-08-04 | Disposition: A | Discharge status: Home / Self Care | Payer: BLUE CROSS/BLUE SHIELD | Source: Home / Self Care | Attending: Emergency Medicine | Admitting: Emergency Medicine

## 2018-08-03 ENCOUNTER — Encounter (HOSPITAL_COMMUNITY): Payer: Self-pay | Admitting: Emergency Medicine

## 2018-08-03 ENCOUNTER — Other Ambulatory Visit: Payer: Self-pay

## 2018-08-03 DIAGNOSIS — Z87891 Personal history of nicotine dependence: Secondary | ICD-10-CM

## 2018-08-03 DIAGNOSIS — R4020 Unspecified coma: Secondary | ICD-10-CM | POA: Diagnosis not present

## 2018-08-03 DIAGNOSIS — R55 Syncope and collapse: Secondary | ICD-10-CM | POA: Diagnosis not present

## 2018-08-03 DIAGNOSIS — R231 Pallor: Secondary | ICD-10-CM | POA: Diagnosis not present

## 2018-08-03 DIAGNOSIS — F419 Anxiety disorder, unspecified: Secondary | ICD-10-CM | POA: Diagnosis not present

## 2018-08-03 DIAGNOSIS — N179 Acute kidney failure, unspecified: Secondary | ICD-10-CM | POA: Insufficient documentation

## 2018-08-03 DIAGNOSIS — R42 Dizziness and giddiness: Secondary | ICD-10-CM | POA: Diagnosis not present

## 2018-08-03 DIAGNOSIS — K219 Gastro-esophageal reflux disease without esophagitis: Secondary | ICD-10-CM | POA: Diagnosis not present

## 2018-08-03 DIAGNOSIS — R001 Bradycardia, unspecified: Secondary | ICD-10-CM | POA: Diagnosis not present

## 2018-08-03 DIAGNOSIS — Z79899 Other long term (current) drug therapy: Secondary | ICD-10-CM | POA: Diagnosis not present

## 2018-08-03 LAB — CBC
HEMATOCRIT: 41.8 % (ref 39.0–52.0)
HEMOGLOBIN: 13.7 g/dL (ref 13.0–17.0)
MCH: 29.8 pg (ref 26.0–34.0)
MCHC: 32.8 g/dL (ref 30.0–36.0)
MCV: 91.1 fL (ref 80.0–100.0)
NRBC: 0 % (ref 0.0–0.2)
Platelets: 280 10*3/uL (ref 150–400)
RBC: 4.59 MIL/uL (ref 4.22–5.81)
RDW: 11.9 % (ref 11.5–15.5)
WBC: 7.6 10*3/uL (ref 4.0–10.5)

## 2018-08-03 MED ORDER — SODIUM CHLORIDE 0.9% FLUSH: 3.0000 mL | Freq: Once | INTRAVENOUS | Status: DC

## 2018-08-03 NOTE — ED Provider Notes (Signed)
[facility] Provider Note   CSN: [id number] Arrival date & time: 08/03/18  2310     History   Chief Complaint Chief Complaint  Patient presents with  . Loss of Consciousness    HPI Katina DegreeJoseph Krahl is a 48 y.o. male.  48yo M w/ PMH including GERD who p/w syncope. Pt was sitting at dinner tonight with his family when he had a witnessed syncopal episode. He remembers starting to feel weak, diaphoretic, and with tunnel vision. He then passed out, family reports him being unresponsive and pale for ~60 sec then regained consciousness. Family notes he lost bladder function but had no shaking or seizure-like activity. He reports having 1 alcoholic drink today. Was in usual state of health today with no URI symptoms, vomiting, fevers, or recent illness. No CP, SOB, or other complaints. He reports 1 previous episode about a month ago where he nearly lost consciousness, had same symptoms leading up to passing out but didn't actually lose consciousness. He did not seek medical attention at that time.  FH notable for grandmother with heart disease.  The history is provided by the patient and a relative.  Loss of Consciousness    Past Medical History:  Diagnosis Date  . Abscess of left shoulder 06/24/2015  . Coagulase-negative staphylococcal infection 07/12/2015   left shoulder wound  . Complication of anesthesia    woke up angry and aggitated  . GERD (gastroesophageal reflux disease)   . Infection    left shoulder wound  . Pectoralis muscle rupture    left  . Pneumonia   . Wears glasses     Patient Active Problem List   Diagnosis Date Noted  . Coagulase-negative staphylococcal infection 07/12/2015  . Abscess of left shoulder 06/24/2015  . Wound infection following procedure 06/24/2015  . GERD (gastroesophageal reflux disease)   . Pectoralis muscle rupture     Past Surgical History:  Procedure Laterality Date  . APPLICATION OF WOUND VAC   07/15/2015  . I&D EXTREMITY Left 07/21/2015   Procedure: IRRIGATION AND DEBRIDEMENT EXTREMITY; LEFT SHOULDER;  Surgeon: Salvatore Marvelobert Wainer, MD;  Location: [facility] OR;  Service: Orthopedics;  Laterality: Left;  . INCISION AND DRAINAGE Left 06/24/2015   Procedure: INCISION AND DRAINAGE LEFT PECTORALIS MUSCLE;  Surgeon: Teryl LucyJoshua Landau, MD;  Location: [facility];  Service: Orthopedics;  Laterality: Left;  . INCISION AND DRAINAGE ABSCESS Left 07/15/2015   Procedure: OPEN INCISION AND DRAINAGE LEFT SHOULDER ;  Surgeon: Salvatore Marvelobert Wainer, MD;  [facility];  Service: Orthopedics;  Laterality: Left;  . IRRIGATION AND DEBRIDEMENT ABSCESS Left 07/15/2015   LEFT SHOULDER  . IRRIGATION AND DEBRIDEMENT SHOULDER Left 07/18/2015   Procedure: IRRIGATION AND DEBRIDEMENT , VERAFLO WOUND VAC PLACEMENT;  Surgeon: Myrene GalasMichael Handy, MD;  Location: [facility] OR;  Service: Orthopedics;  Laterality: Left;  . PECTORALIS TENDON REPAIR Left 06/03/2015   Procedure: LEFT PECTORALIS TENDON REPAIR;  Surgeon: Salvatore Marvelobert Wainer, MD;  Location: [facility];  Service: Orthopedics;  Laterality: Left;  . TONSILLECTOMY    . WISDOM TOOTH EXTRACTION          Home Medications    Prior to Admission medications   Medication Sig Start Date End Date Taking? Authorizing Provider  acetaminophen (TYLENOL) 325 MG tablet Take 2 tablets (650 mg total) by mouth every 6 (six) hours as needed for mild pain (or Fever >/= 101). Patient not taking: Reported on 08/04/2018 06/25/15   Shepperson, Kirstin, PA-C  ALPRAZolam Prudy Feeler(XANAX) 0.5 MG tablet  Take 1-2 tablets (0.5-1 mg total) by mouth 3 (three) times daily as needed for anxiety or sleep. Patient not taking: Reported on 08/04/2018 07/22/15   Shepperson, Kirstin, PA-C  cyclobenzaprine (FLEXERIL) 10 MG tablet Take 1 tablet (10 mg total) by mouth 3 (three) times daily as needed for muscle spasms. Patient not taking: Reported on 08/04/2018 07/22/15   Julien GirtShepperson, Kirstin, PA-C    Family History No family  history on file.  Social History Social History   Tobacco Use  . Smoking status: Former Smoker    Types: Cigarettes    Last attempt to quit: 11/28/2008    Years since quitting: 9.6  . Smokeless tobacco: Never Used  Substance Use Topics  . Alcohol use: Yes    Comment: social  . Drug use: No     Allergies   Chlorhexidine gluconate   Review of Systems Review of Systems  Cardiovascular: Positive for syncope.   All other systems reviewed and are negative except that which was mentioned in HPI   Physical Exam Updated Vital Signs BP 117/69   Pulse 77   Temp 98 F (36.7 C) (Oral)   Resp 15   SpO2 92%   Physical Exam Vitals signs and nursing note reviewed.  Constitutional:      General: He is not in acute distress.    Appearance: He is well-developed.  HENT:     Head: Normocephalic and atraumatic.     Nose: Nose normal.     Mouth/Throat:     Mouth: Mucous membranes are moist.     Pharynx: Oropharynx is clear.  Eyes:     Conjunctiva/sclera: Conjunctivae normal.     Pupils: Pupils are equal, round, and reactive to light.  Neck:     Musculoskeletal: Neck supple.  Cardiovascular:     Rate and Rhythm: Normal rate and regular rhythm.     Heart sounds: Normal heart sounds. No murmur.  Pulmonary:     Effort: Pulmonary effort is normal.     Breath sounds: Normal breath sounds.  Abdominal:     General: Bowel sounds are normal. There is no distension.     Palpations: Abdomen is soft.     Tenderness: There is no abdominal tenderness.  Musculoskeletal:        General: No swelling or tenderness.  Skin:    General: Skin is warm and dry.  Neurological:     Mental Status: He is alert and oriented to person, place, and time.     Comments: Fluent speech  Psychiatric:        Judgment: Judgment normal.     Comments: Pleasant, joking      ED Treatments / Results  Labs (all labs ordered are listed, but only abnormal results are displayed) Labs Reviewed  BASIC METABOLIC  PANEL - Abnormal; Notable for the following components:      Result Value   Potassium 3.2 (*)    CO2 20 (*)    Glucose, Bld 116 (*)    Creatinine, Ser 1.31 (*)    All other components within normal limits  CBG MONITORING, ED - Abnormal; Notable for the following components:   Glucose-Capillary 152 (*)    All other components within normal limits  CBC  URINALYSIS, ROUTINE W REFLEX MICROSCOPIC    EKG EKG Interpretation  Date/Time:  Saturday August 03 2018 23:16:21 EST Ventricular Rate:  74 PR Interval:    QRS Duration: 101 QT Interval:  385 QTC Calculation: 428 R Axis:   26  Text Interpretation:  Sinus rhythm No significant change since last tracing Confirmed by Cathren Laine ([id number]) on 08/03/2018 11:55:08 PM   Radiology No results found.  Procedures Procedures (including critical care time)  Medications Ordered in ED Medications  sodium chloride flush (NS) 0.9 % injection 3 mL (has no administration in time range)   Orthostatic VS for the past 24 hrs:  BP- Lying Pulse- Lying BP- Sitting Pulse- Sitting BP- Standing at 0 minutes Pulse- Standing at 0 minutes  08/04/18 0103 116/67 77 126/72 80 116/69 87      Initial Impression / Assessment and Plan / ED Course  I have reviewed the triage vital signs and the nursing notes.  Pertinent labs & imaging results that were available during my care of the patient were reviewed by me and considered in my medical decision making (see chart for details).     Comfortable and alert on exam with normal vital signs.  Reassuring EKG.  Denied any complaints whatsoever.  His symptoms of tunnel vision, diaphoresis, and weakness prior to syncopal episode suggest vasovagal syncope.  Orthostatics are negative here.  He has not had any preceding illness and denies any chest pain or shortness of breath to suggest ACS or PE.  He has no major comorbidities and per [facility] syncope rules he is low risk for serious event.  Lab work shows normal  CBC, BMP notable for creatinine 1.3.  Patient has been drinking fluids here and I recommended aggressive hydration at home.  I discussed work-up findings with family and they feel comfortable with discharge to follow-up in cardiology clinic.  They understand that he must return immediately if he has any further episodes of syncope or any new symptoms such as chest pain or breathing problems.  Final Clinical Impressions(s) / ED Diagnoses   Final diagnoses:  Syncope and collapse  AKI (acute kidney injury) [facility])    ED Discharge Orders    None       Little, Ambrose Finland, MD 08/04/18 0225

## 2018-08-03 NOTE — ED Triage Notes (Signed)
Patient is from home, sitting at the table tonight.  He did have some vodka this evening, went pale and passed out at dinner.  No cardiac history, does have family history of vertigo.  He states that this happened once before, same circumstances.

## 2018-08-04 ENCOUNTER — Observation Stay (HOSPITAL_COMMUNITY)
Admission: EM | Admit: 2018-08-04 | Discharge: 2018-08-05 | Disposition: A | Discharge status: Home / Self Care | Payer: BLUE CROSS/BLUE SHIELD | Attending: Internal Medicine | Admitting: Internal Medicine

## 2018-08-04 ENCOUNTER — Encounter (HOSPITAL_COMMUNITY): Payer: Self-pay | Admitting: Emergency Medicine

## 2018-08-04 ENCOUNTER — Emergency Department (HOSPITAL_COMMUNITY): Payer: BLUE CROSS/BLUE SHIELD

## 2018-08-04 DIAGNOSIS — F419 Anxiety disorder, unspecified: Secondary | ICD-10-CM | POA: Insufficient documentation

## 2018-08-04 DIAGNOSIS — Z79899 Other long term (current) drug therapy: Secondary | ICD-10-CM | POA: Insufficient documentation

## 2018-08-04 DIAGNOSIS — R55 Syncope and collapse: Principal | ICD-10-CM | POA: Diagnosis present

## 2018-08-04 DIAGNOSIS — K219 Gastro-esophageal reflux disease without esophagitis: Secondary | ICD-10-CM | POA: Diagnosis not present

## 2018-08-04 DIAGNOSIS — R001 Bradycardia, unspecified: Secondary | ICD-10-CM | POA: Insufficient documentation

## 2018-08-04 DIAGNOSIS — Z87891 Personal history of nicotine dependence: Secondary | ICD-10-CM | POA: Insufficient documentation

## 2018-08-04 LAB — COMPREHENSIVE METABOLIC PANEL
ALT: 21 U/L (ref 0–44)
ANION GAP: 13 (ref 5–15)
AST: 19 U/L (ref 15–41)
Albumin: 3.8 g/dL (ref 3.5–5.0)
Alkaline Phosphatase: 46 U/L (ref 38–126)
BUN: 15 mg/dL (ref 6–20)
CO2: 19 mmol/L — AB (ref 22–32)
Calcium: 8.9 mg/dL (ref 8.9–10.3)
Chloride: 104 mmol/L (ref 98–111)
Creatinine, Ser: 1.03 mg/dL (ref 0.61–1.24)
GFR calc Af Amer: >60 mL/min (ref >60)
GFR calc non Af Amer: >60 mL/min (ref >60)
Glucose, Bld: 105 mg/dL — ABNORMAL HIGH (ref 70–99)
Potassium: 3.7 mmol/L (ref 3.5–5.1)
SODIUM: 136 mmol/L (ref 135–145)
Total Bilirubin: 0.4 mg/dL (ref 0.3–1.2)
Total Protein: 6.5 g/dL (ref 6.5–8.1)

## 2018-08-04 LAB — CBC WITH DIFFERENTIAL/PLATELET
Abs Immature Granulocytes: 0.01 10*3/uL (ref 0.00–0.07)
Basophils Absolute: 0 10*3/uL (ref 0.0–0.1)
Basophils Relative: 1 %
EOS ABS: 0.2 10*3/uL (ref 0.0–0.5)
Eosinophils Relative: 3 %
HCT: 40.9 % (ref 39.0–52.0)
Hemoglobin: 13.2 g/dL (ref 13.0–17.0)
Immature Granulocytes: 0 %
Lymphocytes Relative: 30 %
Lymphs Abs: 1.9 10*3/uL (ref 0.7–4.0)
MCH: 29.6 pg (ref 26.0–34.0)
MCHC: 32.3 g/dL (ref 30.0–36.0)
MCV: 91.7 fL (ref 80.0–100.0)
Monocytes Absolute: 0.5 10*3/uL (ref 0.1–1.0)
Monocytes Relative: 8 %
Neutro Abs: 3.7 10*3/uL (ref 1.7–7.7)
Neutrophils Relative %: 58 %
Platelets: 279 10*3/uL (ref 150–400)
RBC: 4.46 MIL/uL (ref 4.22–5.81)
RDW: 11.9 % (ref 11.5–15.5)
WBC: 6.3 10*3/uL (ref 4.0–10.5)
nRBC: 0 % (ref 0.0–0.2)

## 2018-08-04 LAB — BASIC METABOLIC PANEL
ANION GAP: 15 (ref 5–15)
BUN: 14 mg/dL (ref 6–20)
CHLORIDE: 104 mmol/L (ref 98–111)
CO2: 20 mmol/L — AB (ref 22–32)
Calcium: 9.3 mg/dL (ref 8.9–10.3)
Creatinine, Ser: 1.31 mg/dL — ABNORMAL HIGH (ref 0.61–1.24)
GFR calc non Af Amer: >60 mL/min (ref >60)
Glucose, Bld: 116 mg/dL — ABNORMAL HIGH (ref 70–99)
POTASSIUM: 3.2 mmol/L — AB (ref 3.5–5.1)
SODIUM: 139 mmol/L (ref 135–145)

## 2018-08-04 LAB — TROPONIN I: Troponin I: <0.03 ng/mL (ref <0.03)

## 2018-08-04 LAB — RAPID URINE DRUG SCREEN, HOSP PERFORMED
AMPHETAMINES: NOT DETECTED
BENZODIAZEPINES: NOT DETECTED
Barbiturates: NOT DETECTED
Cocaine: NOT DETECTED
Opiates: NOT DETECTED
Tetrahydrocannabinol: POSITIVE — AB

## 2018-08-04 LAB — CBG MONITORING, ED: Glucose-Capillary: 152 mg/dL — ABNORMAL HIGH (ref 70–99)

## 2018-08-04 LAB — ETHANOL: Alcohol, Ethyl (B): <10 mg/dL (ref <10)

## 2018-08-04 MED ORDER — SODIUM CHLORIDE 0.9 % IV BOLUS
1000.0000 mL | Freq: Once | INTRAVENOUS | Status: AC
  Administered 2018-08-05: 1000 mL via INTRAVENOUS

## 2018-08-04 MED ORDER — ENOXAPARIN SODIUM 40 MG/0.4ML ~~LOC~~ SOLN
40.0000 mg | SUBCUTANEOUS | Status: DC
  Filled 2018-08-04: qty 0.4

## 2018-08-04 MED ORDER — LORAZEPAM 2 MG/ML IJ SOLN
1.0000 mg | Freq: Once | INTRAMUSCULAR | Status: AC
  Administered 2018-08-04: 1 mg via INTRAVENOUS

## 2018-08-04 MED ORDER — SODIUM CHLORIDE 0.9% FLUSH
3.0000 mL | Freq: Two times a day (BID) | INTRAVENOUS | Status: DC
  Administered 2018-08-04: 3 mL via INTRAVENOUS

## 2018-08-04 MED ORDER — ACETAMINOPHEN 650 MG RE SUPP: 650.0000 mg | Freq: Four times a day (QID) | RECTAL | Status: DC | PRN

## 2018-08-04 MED ORDER — SENNOSIDES-DOCUSATE SODIUM 8.6-50 MG PO TABS: 1.0000 | ORAL_TABLET | Freq: Every evening | ORAL | Status: DC | PRN

## 2018-08-04 MED ORDER — ACETAMINOPHEN 325 MG PO TABS: 650.0000 mg | ORAL_TABLET | Freq: Four times a day (QID) | ORAL | Status: DC | PRN

## 2018-08-04 MED ORDER — ONDANSETRON HCL 4 MG PO TABS: 4.0000 mg | ORAL_TABLET | Freq: Four times a day (QID) | ORAL | Status: DC | PRN

## 2018-08-04 MED ORDER — LORAZEPAM 2 MG/ML IJ SOLN
INTRAMUSCULAR | Status: AC
  Filled 2018-08-04: qty 1

## 2018-08-04 MED ORDER — ONDANSETRON HCL 4 MG/2ML IJ SOLN: 4.0000 mg | Freq: Four times a day (QID) | INTRAMUSCULAR | Status: DC | PRN

## 2018-08-04 MED ORDER — FAMOTIDINE 20 MG PO TABS: 20.0000 mg | ORAL_TABLET | Freq: Every day | ORAL | Status: DC | PRN

## 2018-08-04 NOTE — ED Provider Notes (Signed)
Care assumed from previous provider Dr. Hyacinth Meeker. Please see note for further details. Briefly, patient is a 48 y.o. male who presented to ER for syncopal episode. Seen yesterday for syncope as well. EKG reassuring. Case discussed, plan agreed upon. Previous provider concerned syncope may be 2/2 arrhythmia.  Will follow up on pending CT and labs. Anticipate admission for high risk syncope.   CT without acute findings. Labs reassuring.  Hospitalist consulted who will admit.    Ward, Chase Picket, PA-C 08/04/18 2047    Gerhard Munch, MD 08/04/18 [phone]

## 2018-08-04 NOTE — ED Notes (Signed)
Patient transported to MRI 

## 2018-08-04 NOTE — ED Notes (Signed)
ED Provider at bedside. 

## 2018-08-04 NOTE — ED Provider Notes (Signed)
[facility] Provider Note   CSN: [id number] Arrival date & time: 08/04/18  1810     History   Chief Complaint Chief Complaint  Patient presents with  . Loss of Consciousness    HPI Katina DegreeJoseph Gehlhausen is a 48 y.o. male.  HPI  48 year old male, no history of significant chronic medical conditions, takes no prescription medications, used to smoke cigarettes, currently drinks occasional alcohol, does not take any other illegal drugs.  According to the patient and his family members of the prior medical record he was here within the last 24 hours because of a syncopal event that was witnessed by family, his son who is a IT sales professionalfirefighter took his blood pressure and his heart rate both of which were very low at the time, he was diaphoretic and pale however he was able to come back to normal.  Throughout the day he has been fatigued and then this evening he had another event while he was on the way to the airport to fly to a business meeting where he again started to have tunnel vision, he became diaphoretic weak and almost passed out again.  He did have some chest discomfort with this episode.  He has never had anything like this before, he used to exercise frequently but in the last 8 months since becoming remarried he has not done much outside of the house and has had a very poor dietary intake.  At this time the patient is feeling mildly fatigued but otherwise symptom-free and has not had any palpitations, shortness of breath, frequent headaches, blurred vision or any other major symptoms.  Past Medical History:  Diagnosis Date  . Abscess of left shoulder 06/24/2015  . Coagulase-negative staphylococcal infection 07/12/2015   left shoulder wound  . Complication of anesthesia    woke up angry and aggitated  . GERD (gastroesophageal reflux disease)   . Infection    left shoulder wound  . Pectoralis muscle rupture    left  . Pneumonia   . Wears glasses      Patient Active Problem List   Diagnosis Date Noted  . Coagulase-negative staphylococcal infection 07/12/2015  . Abscess of left shoulder 06/24/2015  . Wound infection following procedure 06/24/2015  . GERD (gastroesophageal reflux disease)   . Pectoralis muscle rupture     Past Surgical History:  Procedure Laterality Date  . APPLICATION OF WOUND VAC  07/15/2015  . I&D EXTREMITY Left 07/21/2015   Procedure: IRRIGATION AND DEBRIDEMENT EXTREMITY; LEFT SHOULDER;  Surgeon: Salvatore Marvelobert Wainer, MD;  Location: [facility] OR;  Service: Orthopedics;  Laterality: Left;  . INCISION AND DRAINAGE Left 06/24/2015   Procedure: INCISION AND DRAINAGE LEFT PECTORALIS MUSCLE;  Surgeon: Teryl LucyJoshua Landau, MD;  Location: [facility];  Service: Orthopedics;  Laterality: Left;  . INCISION AND DRAINAGE ABSCESS Left 07/15/2015   Procedure: OPEN INCISION AND DRAINAGE LEFT SHOULDER ;  Surgeon: Salvatore Marvelobert Wainer, MD;  [facility];  Service: Orthopedics;  Laterality: Left;  . IRRIGATION AND DEBRIDEMENT ABSCESS Left 07/15/2015   LEFT SHOULDER  . IRRIGATION AND DEBRIDEMENT SHOULDER Left 07/18/2015   Procedure: IRRIGATION AND DEBRIDEMENT , VERAFLO WOUND VAC PLACEMENT;  Surgeon: Myrene GalasMichael Handy, MD;  Location: [facility] OR;  Service: Orthopedics;  Laterality: Left;  . PECTORALIS TENDON REPAIR Left 06/03/2015   Procedure: LEFT PECTORALIS TENDON REPAIR;  Surgeon: Salvatore Marvelobert Wainer, MD;  Location: [facility];  Service: Orthopedics;  Laterality: Left;  . TONSILLECTOMY    . WISDOM TOOTH EXTRACTION  Home Medications    Prior to Admission medications   Medication Sig Start Date End Date Taking? Authorizing Provider  acetaminophen (TYLENOL) 325 MG tablet Take 2 tablets (650 mg total) by mouth every 6 (six) hours as needed for mild pain (or Fever >/= 101). Patient not taking: Reported on 08/04/2018 06/25/15   Shepperson, Kirstin, PA-C  ALPRAZolam Prudy Feeler) 0.5 MG tablet Take 1-2 tablets (0.5-1 mg total) by mouth 3  (three) times daily as needed for anxiety or sleep. Patient not taking: Reported on 08/04/2018 07/22/15   Shepperson, Kirstin, PA-C  cyclobenzaprine (FLEXERIL) 10 MG tablet Take 1 tablet (10 mg total) by mouth 3 (three) times daily as needed for muscle spasms. Patient not taking: Reported on 08/04/2018 07/22/15   Julien Girt, PA-C    Family History No family history on file.  Social History Social History   Tobacco Use  . Smoking status: Former Smoker    Types: Cigarettes    Last attempt to quit: 11/28/2008    Years since quitting: 9.6  . Smokeless tobacco: Never Used  Substance Use Topics  . Alcohol use: Yes    Comment: social  . Drug use: No     Allergies   Chlorhexidine gluconate   Review of Systems Review of Systems  All other systems reviewed and are negative.    Physical Exam Updated Vital Signs BP 121/72 (BP Location: Right Arm)   Pulse 63   Temp 98.6 F (37 C) (Oral)   Resp 16   SpO2 98%   Physical Exam Vitals signs and nursing note reviewed.  Constitutional:      General: He is not in acute distress.    Appearance: He is well-developed.  HENT:     Head: Normocephalic and atraumatic.     Mouth/Throat:     Pharynx: No oropharyngeal exudate.  Eyes:     General: No scleral icterus.       Right eye: No discharge.        Left eye: No discharge.     Conjunctiva/sclera: Conjunctivae normal.     Pupils: Pupils are equal, round, and reactive to light.  Neck:     Musculoskeletal: Normal range of motion and neck supple.     Thyroid: No thyromegaly.     Vascular: No JVD.     Comments: No carotid bruits Cardiovascular:     Rate and Rhythm: Normal rate and regular rhythm.     Heart sounds: Normal heart sounds. No murmur. No friction rub. No gallop.   Pulmonary:     Effort: Pulmonary effort is normal. No respiratory distress.     Breath sounds: Normal breath sounds. No wheezing or rales.  Abdominal:     General: Bowel sounds are normal. There is no  distension.     Palpations: Abdomen is soft. There is no mass.     Tenderness: There is no abdominal tenderness.  Musculoskeletal: Normal range of motion.        General: No tenderness.  Lymphadenopathy:     Cervical: No cervical adenopathy.  Skin:    General: Skin is warm and dry.     Findings: No erythema or rash.  Neurological:     Mental Status: He is alert.     Coordination: Coordination normal.     Comments: Speech is clear, cranial nerves III through XII are intact, memory is intact, strength is normal in all 4 extremities including grips, sensation is intact to light touch and pinprick in all 4 extremities. Coordination  as tested by finger-nose-finger is normal, no limb ataxia. Normal gait, normal reflexes at the patellar tendons bilaterally  Psychiatric:        Behavior: Behavior normal.      ED Treatments / Results  Labs (all labs ordered are listed, but only abnormal results are displayed) Labs Reviewed  CBC WITH DIFFERENTIAL/PLATELET  TROPONIN I  COMPREHENSIVE METABOLIC PANEL  RAPID URINE DRUG SCREEN, HOSP PERFORMED  ETHANOL    EKG EKG Interpretation  Date/Time:  Sunday August 04 2018 18:17:22 EST Ventricular Rate:  64 PR Interval:  146 QRS Duration: 90 QT Interval:  380 QTC Calculation: 392 R Axis:   42 Text Interpretation:  Normal sinus rhythm Normal ECG since last tracing no significant change Confirmed by Eber Hong ([id number]) on 08/04/2018 6:40:40 PM   Radiology No results found.  Procedures Procedures (including critical care time)  Medications Ordered in ED Medications - No data to display   Initial Impression / Assessment and Plan / ED Course  I have reviewed the triage vital signs and the nursing notes.  Pertinent labs & imaging results that were available during my care of the patient were reviewed by me and considered in my medical decision making (see chart for details).     The patient's exam is unremarkable clinically, his EKG is  totally normal without signs of arrhythmia or WPW.  I am concerned that the patient is having arrhythmias given that he has had 3 of these incidents within the last 2 months and 2 of them have been within the last 24 hours.  He has never had anything like this, he does not get anxious, he has never had any chest discomfort until today with it.  Will order repeat EKG as well as labs, CT scan of the brain, he will need to be admitted for a syncope work-up.  Final Clinical Impressions(s) / ED Diagnoses   Final diagnoses:  None    ED Discharge Orders    None       Eber Hong, MD 08/04/18 [place]

## 2018-08-04 NOTE — ED Triage Notes (Signed)
Pt reports he was seen last night for the same and that he had an onset of syncope again today. Son reports pt had +LOC x 2 minutes, was bradycardic, and mildly hypotensive after the episode.

## 2018-08-04 NOTE — H&P (Signed)
History and Physical    Jerry DegreeJoseph Kathman BMW:[id number] DOB: [birth year] DOA: 08/04/2018  Referring MD/NP/PA:   PCP: Patient, No Pcp Per   Patient coming from:  The patient is coming from home.  At baseline, pt is independent for most of ADL.        Chief Complaint: Syncope  HPI: Jerry Carey is a 48 y.o. male with medical history significant of GERD, anxiety, who presents with syncope.  Per patient's son who is a IT sales professionalfirefighter, patient passed out after dinner yesterday, without any prodromal symptoms.  The episode lasted for about 1.5 to 2 minutes.  Patient's son checked his heart rate and found to have slow heart rate and weak pulse.  EMS found patient oxygen saturation 94% on room air.  Patient was seen in ED and had negative work-up including EKG, and discharged home at stable condition.  Today at about 430, patient had another similar episode, described as feeling weak and fainting, but no loss of consciousness this time.  Patient had one episode of chest discomfort, but no shortness of breath, cough.  Patient denies vision change, hearing loss, unilateral weakness, numbness or tingling to extremities.  No facial droop or slurred speech.  Per his son, patient did not have seizure or jerking activity.  Patient denies nausea vomiting, diarrhea, abdominal pain, symptoms of UTI or unilateral weakness.  No fever or chills. Pt states that he flew several times for business meeting recently.  He drove to [place] in February which was 16 hour traveling.   ED Course: pt was found to have WBC 6.3, troponin negative, positive UDS for THC, alcohol level less than 10, creatinine 1.03, BUN 15, temperature normal, bradycardia, oxygen saturation 91 to 98% on room air.  CT head is negative for acute intracranial abnormalities.  Patient is placed on telemetry bed for observation.  Review of Systems:   General: no fevers, chills, no body weight gain, has fatigue HEENT: no blurry vision, hearing changes or  sore throat Respiratory: no dyspnea, coughing, wheezing CV: no chest pain, no palpitations GI: no nausea, vomiting, abdominal pain, diarrhea, constipation GU: no dysuria, burning on urination, increased urinary frequency, hematuria  Ext: no leg edema Neuro: no unilateral weakness, numbness, or tingling, no vision change or hearing loss. Has syncope. Skin: no rash, no skin tear. MSK: No muscle spasm, no deformity, no limitation of range of movement in spin Heme: No easy bruising.  Travel history: No recent long distant travel.  Allergy:  Allergies  Allergen Reactions  . Chlorhexidine Gluconate Rash    Patient states he had severe burning and rash and was given benadryl    Past Medical History:  Diagnosis Date  . Abscess of left shoulder 06/24/2015  . Coagulase-negative staphylococcal infection 07/12/2015   left shoulder wound  . Complication of anesthesia    woke up angry and aggitated  . GERD (gastroesophageal reflux disease)   . Infection    left shoulder wound  . Pectoralis muscle rupture    left  . Pneumonia   . Wears glasses     Past Surgical History:  Procedure Laterality Date  . APPLICATION OF WOUND VAC  07/15/2015  . I&D EXTREMITY Left 07/21/2015   Procedure: IRRIGATION AND DEBRIDEMENT EXTREMITY; LEFT SHOULDER;  Surgeon: Salvatore Marvelobert Wainer, MD;  Location: [facility] OR;  Service: Orthopedics;  Laterality: Left;  . INCISION AND DRAINAGE Left 06/24/2015   Procedure: INCISION AND DRAINAGE LEFT PECTORALIS MUSCLE;  Surgeon: Teryl LucyJoshua Landau, MD;  Location: [facility];  Service:  Orthopedics;  Laterality: Left;  . INCISION AND DRAINAGE ABSCESS Left 07/15/2015   Procedure: OPEN INCISION AND DRAINAGE LEFT SHOULDER ;  Surgeon: Salvatore Marvelobert Wainer, MD;  [facility];  Service: Orthopedics;  Laterality: Left;  . IRRIGATION AND DEBRIDEMENT ABSCESS Left 07/15/2015   LEFT SHOULDER  . IRRIGATION AND DEBRIDEMENT SHOULDER Left 07/18/2015   Procedure: IRRIGATION AND DEBRIDEMENT , VERAFLO  WOUND VAC PLACEMENT;  Surgeon: Myrene GalasMichael Handy, MD;  Location: [facility] OR;  Service: Orthopedics;  Laterality: Left;  . PECTORALIS TENDON REPAIR Left 06/03/2015   Procedure: LEFT PECTORALIS TENDON REPAIR;  Surgeon: Salvatore Marvelobert Wainer, MD;  Location: [facility];  Service: Orthopedics;  Laterality: Left;  . TONSILLECTOMY    . WISDOM TOOTH EXTRACTION      Social History:  reports that he quit smoking about 9 years ago. His smoking use included cigarettes. He has never used smokeless tobacco. He reports current alcohol use. He reports current drug use. Drug: Marijuana.  Family History:  Family History  Problem Relation Age of Onset  . Hypothyroidism Mother   . Meniere's disease Mother   . Parkinson's disease Father      Prior to Admission medications   Medication Sig Start Date End Date Taking? Authorizing Provider  acetaminophen (TYLENOL) 325 MG tablet Take 2 tablets (650 mg total) by mouth every 6 (six) hours as needed for mild pain (or Fever >/= 101). Patient not taking: Reported on 08/04/2018 06/25/15   Shepperson, Kirstin, PA-C  ALPRAZolam Prudy Feeler(XANAX) 0.5 MG tablet Take 1-2 tablets (0.5-1 mg total) by mouth 3 (three) times daily as needed for anxiety or sleep. Patient not taking: Reported on 08/04/2018 07/22/15   Shepperson, Kirstin, PA-C  cyclobenzaprine (FLEXERIL) 10 MG tablet Take 1 tablet (10 mg total) by mouth 3 (three) times daily as needed for muscle spasms. Patient not taking: Reported on 08/04/2018 07/22/15   Julien GirtShepperson, Kirstin, PA-C    Physical Exam: Vitals:   08/04/18 1846  BP: 121/72  Pulse: 63  Resp: 16  Temp: 98.6 F (37 C)  TempSrc: Oral  SpO2: 98%   General: Not in acute distress HEENT:       Eyes: PERRL, EOMI, no scleral icterus.       ENT: No discharge from the ears and nose, no pharynx injection, no tonsillar enlargement.        Neck: No JVD, no bruit, no mass felt. Heme: No neck lymph node enlargement. Cardiac: S1/S2, RRR, No murmurs, No gallops or  rubs. Respiratory: No rales, wheezing, rhonchi or rubs. GI: Soft, nondistended, nontender, no rebound pain, no organomegaly, BS present. GU: No hematuria Ext: No pitting leg edema bilaterally. 2+DP/PT pulse bilaterally. Musculoskeletal: No joint deformities, No joint redness or warmth, no limitation of ROM in spin. Skin: No rashes.  Neuro: Alert, oriented X3, cranial nerves II-XII grossly intact, moves all extremities normally. Muscle strength 5/5 in all extremities, sensation to light touch intact. Brachial reflex 2+ bilaterally. Negative Babinski's sign.  Psych: Patient is not psychotic, no suicidal or hemocidal ideation.  Labs on Admission: I have personally reviewed following labs and imaging studies  CBC: Recent Labs  Lab 08/03/18 2323 08/04/18 1854  WBC 7.6 6.3  NEUTROABS  --  3.7  HGB 13.7 13.2  HCT 41.8 40.9  MCV 91.1 91.7  PLT 280 279   Basic Metabolic Panel: Recent Labs  Lab 08/03/18 2323 08/04/18 1854  NA 139 136  K 3.2* 3.7  CL 104 104  CO2 20* 19*  GLUCOSE 116* 105*  BUN  14 15  CREATININE 1.31* 1.03  CALCIUM 9.3 8.9   GFR: CrCl cannot be calculated (Unknown ideal weight.). Liver Function Tests: Recent Labs  Lab 08/04/18 1854  AST 19  ALT 21  ALKPHOS 46  BILITOT 0.4  PROT 6.5  ALBUMIN 3.8   No results for input(s): LIPASE, AMYLASE in the last 168 hours. No results for input(s): AMMONIA in the last 168 hours. Coagulation Profile: No results for input(s): INR, PROTIME in the last 168 hours. Cardiac Enzymes: Recent Labs  Lab 08/04/18 1854  TROPONINI <0.03   BNP (last 3 results) No results for input(s): PROBNP in the last 8760 hours. HbA1C: No results for input(s): HGBA1C in the last 72 hours. CBG: Recent Labs  Lab 08/04/18 0126  GLUCAP 152*   Lipid Profile: No results for input(s): CHOL, HDL, LDLCALC, TRIG, CHOLHDL, LDLDIRECT in the last 72 hours. Thyroid Function Tests: No results for input(s): TSH, T4TOTAL, FREET4, T3FREE, THYROIDAB  in the last 72 hours. Anemia Panel: No results for input(s): VITAMINB12, FOLATE, FERRITIN, TIBC, IRON, RETICCTPCT in the last 72 hours. Urine analysis: No results found for: COLORURINE, APPEARANCEUR, LABSPEC, PHURINE, GLUCOSEU, HGBUR, BILIRUBINUR, KETONESUR, PROTEINUR, UROBILINOGEN, NITRITE, LEUKOCYTESUR Sepsis Labs: @LABRCNTIP (procalcitonin:4,lacticidven:4) )No results found for this or any previous visit (from the past 240 hour(s)).   Radiological Exams on Admission: Ct Head Wo Contrast  Result Date: 08/04/2018 CLINICAL DATA:  Recurrent syncope EXAM: CT HEAD WITHOUT CONTRAST TECHNIQUE: Contiguous axial images were obtained from the base of the skull through the vertex without intravenous contrast. COMPARISON:  None. FINDINGS: Brain: No acute intracranial abnormality. Specifically, no hemorrhage, hydrocephalus, mass lesion, acute infarction, or significant intracranial injury. Vascular: No hyperdense vessel or unexpected calcification. Skull: No acute calvarial abnormality. Sinuses/Orbits: Visualized paranasal sinuses and mastoids clear. Orbital soft tissues unremarkable. Other: None IMPRESSION: Normal study. Electronically Signed   By: Charlett Nose M.D.   On: 08/04/2018 19:11     EKG: Independently reviewed.  Sinus rhythm, QTC 392, no ischemic change.  Assessment/Plan Principal Problem:   Syncope Active Problems:   GERD (gastroesophageal reflux disease)   Syncope: Etiology is not clear. The differential diagnosis is broad, including vasovagal syncope, bradycardia or cardiac arrhythmia, TIA, orthostatic status and PE given his recent long distance traveling.  - Place on tele bed for obs - Orthostatic vital signs  - MRI-brain - 2d echo - IVF: 1L NS - get D-dimer, if positive-->will get CTA to r/o PE.  GERD (gastroesophageal reflux disease): -prn Pepcid   DVT ppx:  SQ Lovenox Code Status: Full code Family Communication:   Yes, patient's son at bed side Disposition Plan:   Anticipate discharge back to previous home environment Consults called: None Admission status: Obs / tele    Date of Service 08/04/2018    Lorretta Harp [facility]   If 7PM-7AM, please contact night-coverage [email or web address] Password [facility] 08/04/2018, 9:44 PM

## 2018-08-04 NOTE — ED Notes (Signed)
Pt given diet coke, ok by EDP

## 2018-08-04 NOTE — Discharge Instructions (Addendum)
Drink plenty of fluids and avoid dehydration. Call cardiology clinic for next-available appointment. Return to ER if you have any other episodes of passing out, or if you develop chest pain, breathing problems, or sudden changes in symptoms.

## 2018-08-05 ENCOUNTER — Observation Stay (HOSPITAL_BASED_OUTPATIENT_CLINIC_OR_DEPARTMENT_OTHER): Payer: BLUE CROSS/BLUE SHIELD

## 2018-08-05 ENCOUNTER — Other Ambulatory Visit: Payer: Self-pay

## 2018-08-05 ENCOUNTER — Observation Stay (HOSPITAL_COMMUNITY): Payer: BLUE CROSS/BLUE SHIELD

## 2018-08-05 DIAGNOSIS — R55 Syncope and collapse: Secondary | ICD-10-CM | POA: Diagnosis not present

## 2018-08-05 DIAGNOSIS — K219 Gastro-esophageal reflux disease without esophagitis: Secondary | ICD-10-CM | POA: Diagnosis not present

## 2018-08-05 DIAGNOSIS — F1910 Other psychoactive substance abuse, uncomplicated: Secondary | ICD-10-CM

## 2018-08-05 LAB — BASIC METABOLIC PANEL
Anion gap: 7 (ref 5–15)
BUN: 17 mg/dL (ref 6–20)
CO2: 26 mmol/L (ref 22–32)
Calcium: 9 mg/dL (ref 8.9–10.3)
Chloride: 108 mmol/L (ref 98–111)
Creatinine, Ser: 1.17 mg/dL (ref 0.61–1.24)
GFR calc non Af Amer: >60 mL/min (ref >60)
Glucose, Bld: 104 mg/dL — ABNORMAL HIGH (ref 70–99)
POTASSIUM: 3.7 mmol/L (ref 3.5–5.1)
Sodium: 141 mmol/L (ref 135–145)

## 2018-08-05 LAB — CBC
HCT: 40.1 % (ref 39.0–52.0)
Hemoglobin: 13.1 g/dL (ref 13.0–17.0)
MCH: 29.6 pg (ref 26.0–34.0)
MCHC: 32.7 g/dL (ref 30.0–36.0)
MCV: 90.7 fL (ref 80.0–100.0)
Platelets: 257 10*3/uL (ref 150–400)
RBC: 4.42 MIL/uL (ref 4.22–5.81)
RDW: 11.9 % (ref 11.5–15.5)
WBC: 5.9 10*3/uL (ref 4.0–10.5)
nRBC: 0 % (ref 0.0–0.2)

## 2018-08-05 LAB — HIV ANTIBODY (ROUTINE TESTING W REFLEX): HIV Screen 4th Generation wRfx: NONREACTIVE

## 2018-08-05 LAB — D-DIMER, QUANTITATIVE: D-Dimer, Quant: 0.28 ug/mL-FEU (ref 0.00–0.50)

## 2018-08-05 LAB — TSH: TSH: 2.172 u[IU]/mL (ref 0.350–4.500)

## 2018-08-05 LAB — ECHOCARDIOGRAM COMPLETE

## 2018-08-05 LAB — TROPONIN I: Troponin I: <0.03 ng/mL (ref <0.03)

## 2018-08-05 MED ORDER — HYDROXYZINE HCL 25 MG PO TABS: 25.0000 mg | ORAL_TABLET | Freq: Three times a day (TID) | ORAL | 0 refills | Status: DC | PRN

## 2018-08-05 MED ORDER — LORAZEPAM 2 MG/ML IJ SOLN
0.5000 mg | Freq: Once | INTRAMUSCULAR | Status: AC
  Administered 2018-08-05: 0.5 mg via INTRAVENOUS
  Filled 2018-08-05: qty 1

## 2018-08-05 MED ORDER — ZOLPIDEM TARTRATE 5 MG PO TABS
5.0000 mg | ORAL_TABLET | Freq: Every evening | ORAL | Status: DC | PRN
  Administered 2018-08-05: 5 mg via ORAL
  Filled 2018-08-05: qty 1

## 2018-08-05 NOTE — Discharge Summary (Signed)
Physician Discharge Summary  Jerry Carey ZWC:[id number] DOB: 03-May-1971 DOA: 08/04/2018  PCP: Patient, No Pcp Per  Admit date: 08/04/2018 Discharge date: 08/05/2018  Time spent: 45 minutes  Recommendations for Outpatient Follow-up:  1. Follow up with PCP 2-3 weeks for establishment of care and evaluation of symptoms 2. Cardiology will contact to set up holter monitor   Discharge Diagnoses:  Principal Problem:   Syncope Active Problems:   GERD (gastroesophageal reflux disease)   Discharge Condition: stable  Diet recommendation: regular  There were no vitals filed for this visit.  History of present illness:  Jerry Carey is a 48 y.o. male with medical history significant of GERD, anxiety, who presented 08/04/18 with syncope.Per patient's son who is a IT sales professional, patient passed out after dinner yesterday, without any prodromal symptoms.  The episode lasted for about 1.5 to 2 minutes.  Patient's son checked his heart rate and found to have slow heart rate and weak pulse.  EMS found patient oxygen saturation 94% on room air.  Patient was seen in ED and had negative work-up including EKG, and discharged home at stable condition. 14 hours later at 430pm, patient had another similar episode, described as feeling weak and fainting, but no loss of consciousness this time.  Patient had one episode of chest discomfort, but no shortness of breath, cough.  Patient denied vision change, hearing loss, unilateral weakness, numbness or tingling to extremities.  No facial droop or slurred speech.  Per his son, patient did not have seizure or jerking activity.  Patient denied nausea vomiting, diarrhea, abdominal pain, symptoms of UTI or unilateral weakness.  No fever or chills. Pt stated that he flew several times for business meeting recently.  He drove to [place] in February which was 16 hour traveling.    Hospital Course:   Syncope: Etiology is not clear but seems to be cardigenic in nature. CT  head and mri brain both within limits of norma. No s/sx infection. No metabolic derangement. Has episodes of bradycardia on telemetry.  Not orthostatic. D-dime within limits of normal. TSH within limits of normal.  Was provided with 1L NS. Echo results EF greater than 65%, mildly increased left ventricular wall thickness, normal systolic function Evaluated by cardiology who opine that based on history and lack of findings thus far may be arrhythmogenic. Have contacted [employer] for 30 day Holter monitor.    GERD (gastroesophageal reflux disease): stable  Procedures: Echo: see above Consultations:  [employer]  Discharge Exam: Vitals:   08/05/18 0844 08/05/18 1248  BP:  124/75  Pulse: 74 70  Resp:  18  Temp:  98.2 F (36.8 C)  SpO2:  94%    General: awake alert oriented x3. No acute distress Cardiovascular: bradycardia but regular, no mgr no LE edema Respiratory: normal effort BS clear bilaterally no wheeze  Discharge Instructions    Allergies as of 08/05/2018      Reactions   Chlorhexidine Gluconate Rash   Patient states he had severe burning and rash and was given benadryl      Medication List    You have not been prescribed any medications.    Allergies  Allergen Reactions  . Chlorhexidine Gluconate Rash    Patient states he had severe burning and rash and was given benadryl   Follow-up Information    Primary Care at [facility]. Schedule an appointment as soon as possible for a visit.   Specialty:  Family Medicine Contact information: [address] [place] [address]  [phone]           The results of significant diagnostics from this hospitalization (including imaging, microbiology, ancillary and laboratory) are listed below for reference.    Significant Diagnostic Studies: Ct Head Wo Contrast  Result Date: 08/04/2018 CLINICAL DATA:  Recurrent syncope EXAM: CT HEAD WITHOUT CONTRAST TECHNIQUE: Contiguous axial  images were obtained from the base of the skull through the vertex without intravenous contrast. COMPARISON:  None. FINDINGS: Brain: No acute intracranial abnormality. Specifically, no hemorrhage, hydrocephalus, mass lesion, acute infarction, or significant intracranial injury. Vascular: No hyperdense vessel or unexpected calcification. Skull: No acute calvarial abnormality. Sinuses/Orbits: Visualized paranasal sinuses and mastoids clear. Orbital soft tissues unremarkable. Other: None IMPRESSION: Normal study. Electronically Signed   By: Charlett Nose M.D.   On: 08/04/2018 19:11   Mr Brain Wo Contrast  Result Date: 08/05/2018 CLINICAL DATA:  48 y/o M; 3 episodes of syncope in the last 2 months. EXAM: MRI HEAD WITHOUT CONTRAST TECHNIQUE: Multiplanar, multiecho pulse sequences of the brain and surrounding structures were obtained without intravenous contrast. COMPARISON:  08/04/2018 CT head FINDINGS: Brain: No acute infarction, hemorrhage, hydrocephalus, extra-axial collection or mass lesion. No structural or signal abnormality of the brain identified. Vascular: Normal flow voids. Skull and upper cervical spine: Normal marrow signal. Sinuses/Orbits: Negative. Other: Subcentimeter right parietal scalp dermal cyst. IMPRESSION: No acute intracranial abnormality.  Unremarkable MRI of the brain. Electronically Signed   By: Mitzi Hansen M.D.   On: 08/05/2018 01:40    Microbiology: No results found for this or any previous visit (from the past 240 hour(s)).   Labs: Basic Metabolic Panel: Recent Labs  Lab 08/03/18 2323 08/04/18 1854 08/05/18 0220  NA 139 136 141  K 3.2* 3.7 3.7  CL 104 104 108  CO2 20* 19* 26  GLUCOSE 116* 105* 104*  BUN 14 15 17   CREATININE 1.31* 1.03 1.17  CALCIUM 9.3 8.9 9.0   Liver Function Tests: Recent Labs  Lab 08/04/18 1854  AST 19  ALT 21  ALKPHOS 46  BILITOT 0.4  PROT 6.5  ALBUMIN 3.8   No results for input(s): LIPASE, AMYLASE in the last 168  hours. No results for input(s): AMMONIA in the last 168 hours. CBC: Recent Labs  Lab 08/03/18 2323 08/04/18 1854 08/05/18 0220  WBC 7.6 6.3 5.9  NEUTROABS  --  3.7  --   HGB 13.7 13.2 13.1  HCT 41.8 40.9 40.1  MCV 91.1 91.7 90.7  PLT 280 279 257   Cardiac Enzymes: Recent Labs  Lab 08/04/18 1854  TROPONINI <0.03   BNP: BNP (last 3 results) No results for input(s): BNP in the last 8760 hours.  ProBNP (last 3 results) No results for input(s): PROBNP in the last 8760 hours.  CBG: Recent Labs  Lab 08/04/18 0126  GLUCAP 152*       Signed:  Gwenyth Bender NP [facility] 08/05/2018, 4:29 PM

## 2018-08-05 NOTE — ED Notes (Signed)
Breakfast Tray ordered  

## 2018-08-05 NOTE — Progress Notes (Signed)
  Echocardiogram 2D Echocardiogram has been performed.  Jerry Carey 08/05/2018, 11:39 AM

## 2018-08-05 NOTE — Consult Note (Signed)
Cardiology Consultation:   Patient ID: Jerry Carey MRN: [id number]; DOB: 1971-02-12  Admit date: 08/04/2018 Date of Consult: 08/05/2018  Primary Care Provider: Patient, No Pcp Per Primary Cardiologist: No primary care provider on file.  Primary Electrophysiologist:  None    Patient Profile:   Jerry Carey is a 48 y.o. male with a hx of GERD, anxiety who is being seen today for the evaluation of syncope at the request of Dr. Madelyn Flavors.  History of Present Illness:   Jerry Carey presented to the ED 08/03/18 after a syncopal episode at dinner. He felt immediate prodromal symptoms of diaphoresis and weakness, then loss consciousness for about 1.5-2 minutes. His son is an Librarian, academic and reported bradycardia/hypotension and agonal breathing during LOC. He was seen in the ED and had a negative workup so was discharged early AM on 08/04/18. He felt fairly well throughout the day but had intermittent "chest pressure" that lasted for about 15 seconds. At about 4:30 PM while packing for an upcoming trip, he again felt suddenly diaphoretic and weak. He denies syncope/LOC during this second episode. He was brought to the hospital for another workup, which has been negative thus far. He denies any previous history of syncope or cardiac history.   He has no significant medical history. He uses Afrin daily. Tested positive for THC and admits to cocaine use on 07/09/18. He previously used testosterone for athletic performance but has not used in about 7 months.  He has a family history of MI (paternal grandfather died of an MI in his 87s, paternal grandmother in her 46s), mother has an arrhythmia (no further details available), father takes isosorbide.    Past Medical History:  Diagnosis Date  . Abscess of left shoulder 06/24/2015  . Coagulase-negative staphylococcal infection 07/12/2015   left shoulder wound  . Complication of anesthesia    woke up angry and aggitated  . GERD  (gastroesophageal reflux disease)   . Infection    left shoulder wound  . Pectoralis muscle rupture    left  . Pneumonia   . Wears glasses     Past Surgical History:  Procedure Laterality Date  . APPLICATION OF WOUND VAC  07/15/2015  . I&D EXTREMITY Left 07/21/2015   Procedure: IRRIGATION AND DEBRIDEMENT EXTREMITY; LEFT SHOULDER;  Surgeon: Salvatore Marvel, MD;  Location: [facility] OR;  Service: Orthopedics;  Laterality: Left;  . INCISION AND DRAINAGE Left 06/24/2015   Procedure: INCISION AND DRAINAGE LEFT PECTORALIS MUSCLE;  Surgeon: Teryl Lucy, MD;  Location: [facility];  Service: Orthopedics;  Laterality: Left;  . INCISION AND DRAINAGE ABSCESS Left 07/15/2015   Procedure: OPEN INCISION AND DRAINAGE LEFT SHOULDER ;  Surgeon: Salvatore Marvel, MD;  Location: [facility] OR;  Service: Orthopedics;  Laterality: Left;  . IRRIGATION AND DEBRIDEMENT ABSCESS Left 07/15/2015   LEFT SHOULDER  . IRRIGATION AND DEBRIDEMENT SHOULDER Left 07/18/2015   Procedure: IRRIGATION AND DEBRIDEMENT , VERAFLO WOUND VAC PLACEMENT;  Surgeon: Myrene Galas, MD;  Location: [facility] OR;  Service: Orthopedics;  Laterality: Left;  . PECTORALIS TENDON REPAIR Left 06/03/2015   Procedure: LEFT PECTORALIS TENDON REPAIR;  Surgeon: Salvatore Marvel, MD;  Location: [facility];  Service: Orthopedics;  Laterality: Left;  . TONSILLECTOMY    . WISDOM TOOTH EXTRACTION       Home Medications:  Prior to Admission medications   Not on File    Inpatient Medications: Scheduled Meds: . enoxaparin (LOVENOX) injection  40 mg Subcutaneous Q24H  . sodium chloride flush  3  mL Intravenous Q12H   Continuous Infusions:  PRN Meds: acetaminophen **OR** acetaminophen, famotidine, ondansetron **OR** ondansetron (ZOFRAN) IV, senna-docusate, zolpidem  Allergies:    Allergies  Allergen Reactions  . Chlorhexidine Gluconate Rash    Patient states he had severe burning and rash and was given benadryl    Social History:   Social  History   Socioeconomic History  . Marital status: Married    Spouse name: Not on file  . Number of children: Not on file  . Years of education: Not on file  . Highest education level: Not on file  Occupational History  . Not on file  Social Needs  . Financial resource strain: Not on file  . Food insecurity:    Worry: Not on file    Inability: Not on file  . Transportation needs:    Medical: Not on file    Non-medical: Not on file  Tobacco Use  . Smoking status: Former Smoker    Types: Cigarettes    Last attempt to quit: 11/28/2008    Years since quitting: 9.6  . Smokeless tobacco: Never Used  Substance and Sexual Activity  . Alcohol use: Yes    Comment: social  . Drug use: Yes    Types: Marijuana  . Sexual activity: Not on file  Lifestyle  . Physical activity:    Days per week: Not on file    Minutes per session: Not on file  . Stress: Not on file  Relationships  . Social connections:    Talks on phone: Not on file    Gets together: Not on file    Attends religious service: Not on file    Active member of club or organization: Not on file    Attends meetings of clubs or organizations: Not on file    Relationship status: Not on file  . Intimate partner violence:    Fear of current or ex partner: Not on file    Emotionally abused: Not on file    Physically abused: Not on file    Forced sexual activity: Not on file  Other Topics Concern  . Not on file  Social History Narrative  . Not on file    Family History:   Mother: arrhythmia (further details unknown) Paternal grandmother: died of MI in her 61s Paternal grandfather: died of MI in his 95s  Family History  Problem Relation Age of Onset  . Hypothyroidism Mother   . Meniere's disease Mother   . Parkinson's disease Father      ROS:  Please see the history of present illness.   All other ROS reviewed and negative.     Physical Exam/Data:   Vitals:   08/05/18 9518 08/05/18 0841 08/05/18 0844 08/05/18  1248  BP:    124/75  Pulse: 67 78 74 70  Resp:    18  Temp:    98.2 F (36.8 C)  TempSrc:    Oral  SpO2:    94%    Intake/Output Summary (Last 24 hours) at 08/05/2018 1515 Last data filed at 08/05/2018 0324 Gross per 24 hour  Intake 2003 ml  Output -  Net 2003 ml   Last 3 Weights 07/15/2015 07/14/2015 07/12/2015  Weight (lbs) 204 lb 204 lb 6.4 oz 203 lb  Weight (kg) 92.534 kg 92.715 kg 92.08 kg     There is no height or weight on file to calculate BMI.  General:  Well nourished, well developed, in no acute distress Neck: no JVD  Vascular: No carotid bruits; FA pulses 2+ bilaterally without bruits  Cardiac:  normal S1, S2; RRR; no murmur Lungs:  clear to auscultation bilaterally, no wheezing, rhonchi or rales  Ext: LE soft, non-tender, symmetric. No edema. Neuro:  CNs 2-12 intact, no focal abnormalities noted Psych:  Anxious. Normal affect.  EKG:  The EKG was personally reviewed and demonstrates:  EKG 08/04/18 showed NSR with HR 64 bpm Telemetry:  Telemetry was personally reviewed and demonstrates:  NSR with occasional bradycardia to 48-50 bpm.  Relevant CV Studies: Echo 08/05/18  1. The left ventricle has hyperdynamic systolic function of >65%. The cavity size was normal. There is mildly increased left ventricular wall thickness. Echo evidence of normal diastolic relaxation.  2. The right ventricle has normal systolic function. The cavity was normal. There is no increase in right ventricular wall thickness.  3. The mitral valve is normal in structure.  4. The tricuspid valve is normal in structure.  5. The aortic valve is tricuspid There is mild thickening of the aortic valve.  Laboratory Data:  Chemistry Recent Labs  Lab 08/03/18 2323 08/04/18 1854 08/05/18 0220  NA 139 136 141  K 3.2* 3.7 3.7  CL 104 104 108  CO2 20* 19* 26  GLUCOSE 116* 105* 104*  BUN 14 15 17   CREATININE 1.31* 1.03 1.17  CALCIUM 9.3 8.9 9.0  GFRNONAA >60 >60 >60  GFRAA >60 >60 >60  ANIONGAP 15  13 7     Recent Labs  Lab 08/04/18 1854  PROT 6.5  ALBUMIN 3.8  AST 19  ALT 21  ALKPHOS 46  BILITOT 0.4   Hematology Recent Labs  Lab 08/03/18 2323 08/04/18 1854 08/05/18 0220  WBC 7.6 6.3 5.9  RBC 4.59 4.46 4.42  HGB 13.7 13.2 13.1  HCT 41.8 40.9 40.1  MCV 91.1 91.7 90.7  MCH 29.8 29.6 29.6  MCHC 32.8 32.3 32.7  RDW 11.9 11.9 11.9  PLT 280 279 257   Cardiac Enzymes Recent Labs  Lab 08/04/18 1854  TROPONINI <0.03   No results for input(s): TROPIPOC in the last 168 hours.  BNPNo results for input(s): BNP, PROBNP in the last 168 hours.  DDimer  Recent Labs  Lab 08/05/18 0221  DDIMER 0.28    Radiology/Studies:  Ct Head Wo Contrast  Result Date: 08/04/2018 CLINICAL DATA:  Recurrent syncope EXAM: CT HEAD WITHOUT CONTRAST TECHNIQUE: Contiguous axial images were obtained from the base of the skull through the vertex without intravenous contrast. COMPARISON:  None. FINDINGS: Brain: No acute intracranial abnormality. Specifically, no hemorrhage, hydrocephalus, mass lesion, acute infarction, or significant intracranial injury. Vascular: No hyperdense vessel or unexpected calcification. Skull: No acute calvarial abnormality. Sinuses/Orbits: Visualized paranasal sinuses and mastoids clear. Orbital soft tissues unremarkable. Other: None IMPRESSION: Normal study. Electronically Signed   By: Charlett NoseKevin  Dover M.D.   On: 08/04/2018 19:11   Mr Brain Wo Contrast  Result Date: 08/05/2018 CLINICAL DATA:  48 y/o M; 3 episodes of syncope in the last 2 months. EXAM: MRI HEAD WITHOUT CONTRAST TECHNIQUE: Multiplanar, multiecho pulse sequences of the brain and surrounding structures were obtained without intravenous contrast. COMPARISON:  08/04/2018 CT head FINDINGS: Brain: No acute infarction, hemorrhage, hydrocephalus, extra-axial collection or mass lesion. No structural or signal abnormality of the brain identified. Vascular: Normal flow voids. Skull and upper cervical spine: Normal marrow signal.  Sinuses/Orbits: Negative. Other: Subcentimeter right parietal scalp dermal cyst. IMPRESSION: No acute intracranial abnormality.  Unremarkable MRI of the brain. Electronically Signed   By: Mitzi HansenLance  Furusawa-Stratton  M.D.   On: 08/05/2018 01:40    Assessment and Plan:   1. Syncope 2 episodes of syncope within a 24 hour period with prodrome of weakness and diaphoresis; denies chest pain and palpitations as preceding symptoms. Workup including MRI of the brain, CBC, BMP, TSH, D-dimer, orthostatic vitals WNL. No evidence of arrhythmia on telemetry. Echo did not reveal any significant abnormalities. Patient is stable. Based on history and lack of findings thus far, syncope may be arrhythmogenic in etiology.  - Recommend a 30 day event recorder upon discharge    For questions or updates, please contact [facility] Please consult [email or web address] for contact info under     Signed, Penelope Galasllie R Rees Santistevan, Student-PA  08/05/2018 3:15 PM

## 2018-08-06 ENCOUNTER — Other Ambulatory Visit: Payer: Self-pay | Admitting: Cardiology

## 2018-08-06 DIAGNOSIS — R55 Syncope and collapse: Secondary | ICD-10-CM

## 2018-08-16 ENCOUNTER — Ambulatory Visit (INDEPENDENT_AMBULATORY_CARE_PROVIDER_SITE_OTHER): Payer: BLUE CROSS/BLUE SHIELD

## 2018-08-16 DIAGNOSIS — R55 Syncope and collapse: Secondary | ICD-10-CM

## 2018-09-05 ENCOUNTER — Telehealth: Payer: Self-pay | Admitting: *Deleted

## 2018-09-05 NOTE — Telephone Encounter (Signed)
Spoke with pt re critical recording sinus bradycardia v tach (18 sec) no symptoms.Discussed with Dr Ladona Ridgel pt needs appt in 1-2 weeks for syncope and echo has been done at ED visit  Appt made for 09/18/18 at 11:00 am Pt aware./cy

## 2018-09-12 NOTE — Telephone Encounter (Signed)
Moved Pt up to 9:30 am.  Pt indicates understanding.

## 2018-09-18 ENCOUNTER — Ambulatory Visit: Payer: BLUE CROSS/BLUE SHIELD | Admitting: Internal Medicine

## 2018-09-18 ENCOUNTER — Encounter (INDEPENDENT_AMBULATORY_CARE_PROVIDER_SITE_OTHER): Payer: Self-pay

## 2018-09-18 ENCOUNTER — Other Ambulatory Visit: Payer: Self-pay

## 2018-09-18 ENCOUNTER — Encounter: Payer: Self-pay | Admitting: Internal Medicine

## 2018-09-18 VITALS — BP 116/76 | HR 71 | Ht 73.0 in | Wt 218.0 lb

## 2018-09-18 DIAGNOSIS — I472 Ventricular tachycardia, unspecified: Secondary | ICD-10-CM

## 2018-09-18 DIAGNOSIS — R55 Syncope and collapse: Secondary | ICD-10-CM

## 2018-09-18 MED ORDER — METOPROLOL TARTRATE 100 MG PO TABS: 100.0000 mg | ORAL_TABLET | Freq: Once | ORAL | 0 refills | Status: DC

## 2018-09-18 NOTE — Patient Instructions (Addendum)
Medication Instructions:  Your physician recommends that you continue on your current medications as directed. Please refer to the Current Medication list given to you today.  Labwork: None ordered.  Testing/Procedures: Your physician has requested that you have cardiac CT. Cardiac computed tomography (CT) is a painless test that uses an x-ray machine to take clear, detailed pictures of your heart. For further information please visit [email or web address] Please follow instruction sheet as given.  We will schedule this when possible.  Follow-Up: Your physician wants you to follow-up in: 3-4 months with Dr. Ladona Ridgel.       Cardiac CT instructions:   Please arrive at the [facility] main entrance of [facility] 30-45 minutes prior to test start time [facility] [address] [place], [place] [address] [phone]  Proceed to the [facility] Radiology Department (First Floor).  Please follow these instructions carefully (unless otherwise directed):  On the Night Before the Test: . Be sure to Drink plenty of water. . Do not consume any caffeinated/decaffeinated beverages or chocolate 12 hours prior to your test. . Do not take any antihistamines 12 hours prior to your test.  On the Day of the Test: . Drink plenty of water. Do not drink any water within one hour of the test. . Do not eat any food 4 hours prior to the test.  . Take metoprolol (Lopressor) two hours prior to test.       -Take metoprolol (Lopressor) 2 hours prior to test (if applicable).                  -If HR is less than 55 BPM- No Beta Blocker                -IF HR is greater than 55 BPM and patient is less than or equal to 27 yrs old Lopressor 100mg  x1.                  After the Test: . Drink plenty of water. . After receiving IV contrast, you may experience a mild flushed feeling. This is normal. . On occasion, you may experience a mild rash up to 24 hours after the test. This is not  dangerous. If this occurs, you can take Benadryl 25 mg and increase your fluid intake. . If you experience trouble breathing, this can be serious. If it is severe call 911 IMMEDIATELY. If it is mild, please call our office.

## 2018-09-18 NOTE — Progress Notes (Signed)
HPI Jerry Carey is referred today by Dr. Cristal Deer for evaluation of syncope and VT. He is a pleasant 48 yo man with a h/o tobacco abuse, who previously lifted weights competitively but has been in good health except for a lot of stress in his life. He has 6 children. The patient describes an episode of near syncope in December where he was entertaining and almost passed out. He got clammy/sweating profusely, laid down and eventually the spell passed. In January he was at the dinner table with all of his children and new wife and became hot, started to sweat, and awoke with the paramedics at his home. His son who is a Company secretary noted that his pulse was very slow and weak. He had urinary incontinence and was profoundly diaphoretic. He did not ever experience palpitations, sob, or chest pain. The patient was evaluated in the ED and workup was unremarkable. He has felt well since then. He wore a cardiac monitor and this demonstrated NSVT at 200/min. He was asymptomatic. Might have been sleeping, not sure. No family h/o sudden death. He has had no change in his ability to exercise.  Allergies  Allergen Reactions  . Chlorhexidine Gluconate Rash    Patient states he had severe burning and rash and was given benadryl     No current outpatient medications on file.   No current facility-administered medications for this visit.      Past Medical History:  Diagnosis Date  . Abscess of left shoulder 06/24/2015  . Coagulase-negative staphylococcal infection 07/12/2015   left shoulder wound  . Complication of anesthesia    woke up angry and aggitated  . GERD (gastroesophageal reflux disease)   . Infection    left shoulder wound  . Pectoralis muscle rupture    left  . Pneumonia   . Wears glasses     ROS:   All systems reviewed and negative except as noted in the HPI.   Past Surgical History:  Procedure Laterality Date  . APPLICATION OF WOUND VAC  07/15/2015  . I&D EXTREMITY Left  07/21/2015   Procedure: IRRIGATION AND DEBRIDEMENT EXTREMITY; LEFT SHOULDER;  Surgeon: Salvatore Marvel, MD;  Location: [facility] OR;  Service: Orthopedics;  Laterality: Left;  . INCISION AND DRAINAGE Left 06/24/2015   Procedure: INCISION AND DRAINAGE LEFT PECTORALIS MUSCLE;  Surgeon: Teryl Lucy, MD;  Location: [facility];  Service: Orthopedics;  Laterality: Left;  . INCISION AND DRAINAGE ABSCESS Left 07/15/2015   Procedure: OPEN INCISION AND DRAINAGE LEFT SHOULDER ;  Surgeon: Salvatore Marvel, MD;  Location: [facility] OR;  Service: Orthopedics;  Laterality: Left;  . IRRIGATION AND DEBRIDEMENT ABSCESS Left 07/15/2015   LEFT SHOULDER  . IRRIGATION AND DEBRIDEMENT SHOULDER Left 07/18/2015   Procedure: IRRIGATION AND DEBRIDEMENT , VERAFLO WOUND VAC PLACEMENT;  Surgeon: Myrene Galas, MD;  Location: [facility] OR;  Service: Orthopedics;  Laterality: Left;  . PECTORALIS TENDON REPAIR Left 06/03/2015   Procedure: LEFT PECTORALIS TENDON REPAIR;  Surgeon: Salvatore Marvel, MD;  Location: [facility];  Service: Orthopedics;  Laterality: Left;  . TONSILLECTOMY    . WISDOM TOOTH EXTRACTION       Family History  Problem Relation Age of Onset  . Hypothyroidism Mother   . Meniere's disease Mother   . Parkinson's disease Father      Social History   Socioeconomic History  . Marital status: Married    Spouse name: Not on file  . Number of children: Not on file  .  Years of education: Not on file  . Highest education level: Not on file  Occupational History  . Not on file  Social Needs  . Financial resource strain: Not on file  . Food insecurity:    Worry: Not on file    Inability: Not on file  . Transportation needs:    Medical: Not on file    Non-medical: Not on file  Tobacco Use  . Smoking status: Former Smoker    Types: Cigarettes    Last attempt to quit: 11/28/2008    Years since quitting: 9.8  . Smokeless tobacco: Never Used  Substance and Sexual Activity  . Alcohol use: Yes     Comment: social  . Drug use: Yes    Types: Marijuana  . Sexual activity: Not on file  Lifestyle  . Physical activity:    Days per week: Not on file    Minutes per session: Not on file  . Stress: Not on file  Relationships  . Social connections:    Talks on phone: Not on file    Gets together: Not on file    Attends religious service: Not on file    Active member of club or organization: Not on file    Attends meetings of clubs or organizations: Not on file    Relationship status: Not on file  . Intimate partner violence:    Fear of current or ex partner: Not on file    Emotionally abused: Not on file    Physically abused: Not on file    Forced sexual activity: Not on file  Other Topics Concern  . Not on file  Social History Narrative  . Not on file     BP 116/76   Pulse 71   Ht 6\' 1"  (1.854 m)   Wt 218 lb (98.9 kg)   SpO2 97%   BMI 28.76 kg/m   Physical Exam:  Well appearing 48 yo man, NAD HEENT: Unremarkable Neck:  6 cm JVD, no thyromegally Lymphatics:  No adenopathy Back:  No CVA tenderness Lungs:  Clear with no wheezes HEART:  Regular rate rhythm, no murmurs, no rubs, no clicks Abd:  soft, positive bowel sounds, no organomegally, no rebound, no guarding Ext:  2 plus pulses, no edema, no cyanosis, no clubbing Skin:  No rashes no nodules Neuro:  CN II through XII intact, motor grossly intact  EKG - reviewed. NSR Cardiac monitor - NSVT at 200/min   Assess/Plan: 1. Syncope - the most likely explanation of his symptoms is that they occurred due to neurally mediated syncope. I discussed the pathophysiology of these spells and discussed the treatment. I strongly encouraged him to avoid caffeine and ETOH. I asked him to lie down when he felt a spell coming on. I have not restricted his driving at this point. He has a period of warning.  2. NSVT - he has no symptoms of CAD. He has some cardiac risk factors. I have asked him to undergo a coronary CT scan to look at  his perfusion.  I spent over 45 minutes with the patient including 50% face to face time.  Leonia Reeves.D.

## 2018-11-21 ENCOUNTER — Telehealth: Payer: Self-pay | Admitting: Internal Medicine

## 2018-11-21 NOTE — Telephone Encounter (Signed)
Left message to call and schedule cardiac ct  °

## 2018-12-02 ENCOUNTER — Other Ambulatory Visit: Payer: Self-pay

## 2018-12-02 ENCOUNTER — Ambulatory Visit (HOSPITAL_COMMUNITY): Admission: RE | Admit: 2018-12-02 | Payer: BLUE CROSS/BLUE SHIELD | Source: Ambulatory Visit

## 2018-12-02 ENCOUNTER — Encounter (HOSPITAL_COMMUNITY): Payer: Self-pay

## 2018-12-02 ENCOUNTER — Ambulatory Visit (HOSPITAL_COMMUNITY)
Admission: RE | Admit: 2018-12-02 | Discharge: 2018-12-02 | Disposition: A | Discharge status: Home / Self Care | Payer: BLUE CROSS/BLUE SHIELD | Source: Ambulatory Visit | Attending: Internal Medicine | Admitting: Internal Medicine

## 2018-12-02 DIAGNOSIS — I472 Ventricular tachycardia, unspecified: Secondary | ICD-10-CM

## 2018-12-02 DIAGNOSIS — R55 Syncope and collapse: Secondary | ICD-10-CM | POA: Insufficient documentation

## 2018-12-02 MED ORDER — NITROGLYCERIN 0.4 MG SL SUBL
SUBLINGUAL_TABLET | SUBLINGUAL | Status: AC
  Filled 2018-12-02: qty 2

## 2018-12-02 MED ORDER — NITROGLYCERIN 0.4 MG SL SUBL
0.8000 mg | SUBLINGUAL_TABLET | Freq: Once | SUBLINGUAL | Status: AC
  Administered 2018-12-02: 0.8 mg via SUBLINGUAL

## 2018-12-02 MED ORDER — METOPROLOL TARTRATE 5 MG/5ML IV SOLN
INTRAVENOUS | Status: AC
  Filled 2018-12-02: qty 15

## 2018-12-02 MED ORDER — METOPROLOL TARTRATE 5 MG/5ML IV SOLN: 5.0000 mg | INTRAVENOUS | Status: DC | PRN

## 2018-12-02 MED ORDER — IOHEXOL 350 MG/ML SOLN
80.0000 mL | Freq: Once | INTRAVENOUS | Status: AC | PRN
  Administered 2018-12-02: 80 mL via INTRAVENOUS

## 2018-12-02 NOTE — Progress Notes (Signed)
CT scan completed. Tolerated well. D/C home walking. Awake and alert. In no distress. 

## 2018-12-03 ENCOUNTER — Telehealth: Payer: Self-pay

## 2018-12-03 NOTE — Telephone Encounter (Signed)
-----   Message from Evans Lance, MD sent at 12/02/2018  8:01 PM EDT ----- Doristine Devoid result. No coronary disease. No calcium. Followup with me in the office in 4-6 weeks.

## 2018-12-23 ENCOUNTER — Telehealth: Payer: Self-pay | Admitting: Internal Medicine

## 2018-12-23 NOTE — Telephone Encounter (Signed)
New Message     Pt is wondering if his appt tomorrow can be a telelhealth visit     Please call back

## 2018-12-24 ENCOUNTER — Telehealth (INDEPENDENT_AMBULATORY_CARE_PROVIDER_SITE_OTHER): Payer: BC Managed Care – PPO | Admitting: Internal Medicine

## 2018-12-24 ENCOUNTER — Other Ambulatory Visit: Payer: Self-pay

## 2018-12-24 DIAGNOSIS — R55 Syncope and collapse: Secondary | ICD-10-CM | POA: Diagnosis not present

## 2018-12-24 DIAGNOSIS — R0683 Snoring: Secondary | ICD-10-CM

## 2018-12-24 NOTE — Telephone Encounter (Signed)
Pt changed to virtual today

## 2018-12-24 NOTE — Progress Notes (Signed)
Electrophysiology TeleHealth Note   Due to national recommendations of social distancing due to COVID 19, an audio/video telehealth visit is felt to be most appropriate for this patient at this time.  See MyChart message from today for the patient's consent to telehealth for [name].   Date:  12/24/2018   ID:  Jerry Carey, DOB 1970/07/16, MRN [id number]  Location: patient's home  Provider location: [address] [place]  Evaluation Performed: Follow-up visit  PCP:  Patient, No Pcp Per  Cardiologist:  Buford Dresser, MD  Electrophysiologist:  Dr Lovena Le  Chief Complaint:  "I am doing ok."  History of Present Illness:    Jerry Carey is a 48 y.o. male who presents via audio/video conferencing for a telehealth visit today.  Since last being seen in our clinic, the patient reports doing very well. He is a very pleasant middle aged man with a h/o syncope. His symptoms were fairly typical for autonomic dysfunction. As part of his workup his echo was normal but a cardiac monitor demonstrated NSVT. A subsequent Coronary CT angio demonstrated normal coronaries with no calcium. He feels well. His only complaint today is that his wife tells him he snores. He has never been diagnosed with sleep apnea. Today, he denies symptoms of palpitations, chest pain, shortness of breath,  lower extremity edema, dizziness, presyncope, or syncope.  The patient is otherwise without complaint today.  The patient denies symptoms of fevers, chills, cough, or new SOB worrisome for COVID 19.  Past Medical History:  Diagnosis Date  . Abscess of left shoulder 06/24/2015  . Coagulase-negative staphylococcal infection 07/12/2015   left shoulder wound  . Complication of anesthesia    woke up angry and aggitated  . GERD (gastroesophageal reflux disease)   . Infection    left shoulder wound  . Pectoralis muscle rupture    left  . Pneumonia   . Wears glasses     Past Surgical  History:  Procedure Laterality Date  . APPLICATION OF WOUND VAC  07/15/2015  . I&D EXTREMITY Left 07/21/2015   Procedure: IRRIGATION AND DEBRIDEMENT EXTREMITY; LEFT SHOULDER;  Surgeon: Elsie Saas, MD;  Location: [place];  Service: Orthopedics;  Laterality: Left;  . INCISION AND DRAINAGE Left 06/24/2015   Procedure: INCISION AND DRAINAGE LEFT PECTORALIS MUSCLE;  Surgeon: Marchia Bond, MD;  Location: [place];  Service: Orthopedics;  Laterality: Left;  . INCISION AND DRAINAGE ABSCESS Left 07/15/2015   Procedure: OPEN INCISION AND DRAINAGE LEFT SHOULDER ;  Surgeon: Elsie Saas, MD;  Location: [place];  Service: Orthopedics;  Laterality: Left;  . IRRIGATION AND DEBRIDEMENT ABSCESS Left 07/15/2015   LEFT SHOULDER  . IRRIGATION AND DEBRIDEMENT SHOULDER Left 07/18/2015   Procedure: IRRIGATION AND DEBRIDEMENT , VERAFLO WOUND VAC PLACEMENT;  Surgeon: Altamese , MD;  Location: [place];  Service: Orthopedics;  Laterality: Left;  . PECTORALIS TENDON REPAIR Left 06/03/2015   Procedure: LEFT PECTORALIS TENDON REPAIR;  Surgeon: Elsie Saas, MD;  Location: [place];  Service: Orthopedics;  Laterality: Left;  . TONSILLECTOMY    . WISDOM TOOTH EXTRACTION      Current Outpatient Medications  Medication Sig Dispense Refill  . metoprolol tartrate (LOPRESSOR) 100 MG tablet Take 1 tablet (100 mg total) by mouth once for 1 dose. Take one tablet 2 hours prior to test 1 tablet 0   No current facility-administered medications for this visit.     Allergies:   Chlorhexidine gluconate   Social History:  The patient  reports that he quit smoking about 10 years ago. His smoking use included cigarettes. He has never used smokeless tobacco. He reports current alcohol use. He reports current drug use. Drug: Marijuana.   Family History:  The patient's  family history includes Hypothyroidism in his mother; Meniere's disease in his mother; Parkinson's disease in his father.   ROS:   Please see the history of present illness.   All other systems are personally reviewed and negative.    Exam:    Vital Signs:  There were no vitals taken for this visit.    Labs/Other Tests and Data Reviewed:    Recent Labs: 08/04/2018: ALT 21 08/05/2018: BUN 17; Creatinine, Ser 1.17; Hemoglobin 13.1; Platelets 257; Potassium 3.7; Sodium 141; TSH 2.172   Wt Readings from Last 3 Encounters:  09/18/18 218 lb (98.9 kg)  07/15/15 204 lb (92.5 kg)  07/14/15 204 lb 6.4 oz (92.7 kg)     Other studies personally reviewed: Additional studies/ records that were reviewed today include: NSVT on cardiac monitor and normal coronaries on CT angio.   ASSESSMENT & PLAN:    1.  Syncope - the most likely scenario is autonomic dysfunction. I encouraged him to remain well hydrated and to lie down if he feels an episode coming on. 2. NSVT - this occurred at night while he was sleeping. He is asymptomatic. His EF is normal and he does not have CAD. He will undergo watchful waiting.  3. Possible sleep apnea - he snores loudly. I have recommended he undergo a home sleep study. If he demonstrates sleep apnea, he will see Dr. Wonda ChengK. 4. COVID 19 screen The patient denies symptoms of COVID 19 at this time.  The importance of social distancing was discussed today.  Follow-up:  6 months Next remote: n/a  Current medicines are reviewed at length with the patient today.   The patient does not have concerns regarding his medicines.  The following changes were made today:  none  Labs/ tests ordered today include: none No orders of the defined types were placed in this encounter.    Patient Risk:  after full review of this patients clinical status, I feel that they are at moderate risk at this time.  Today, I have spent 15 minutes with the patient with telehealth technology discussing all of the above.    Signed, Lewayne BuntingGregg , MD  12/24/2018 5:03 PM     [facility] [address]  [place] [address] [phone] (office) [phone] (fax)

## 2018-12-26 ENCOUNTER — Telehealth: Payer: Self-pay

## 2018-12-26 NOTE — Telephone Encounter (Signed)
Patient Name: Jerry Carey        DOB: 06-04-1971      Height:     Weight:  218 pounds  Office Name: [facility]          Referring Provider: Dr. Cristopher Peru  Today's Date:  12/26/2018 Date:   STOP BANG RISK ASSESSMENT S (snore) Have you been told that you snore?     YES   T (tired) Are you often tired, fatigued, or sleepy during the day?   YES  O (obstruction) Do you stop breathing, choke, or gasp during sleep? YES   P (pressure) Do you have or are you being treated for high blood pressure? NO   B (BMI) Is your body index greater than 35 kg/m? NO   A (age) Are you 51 years old or older? YES   N (neck) Do you have a neck circumference greater than 16 inches?   YES   G (gender) Are you a male? YES   TOTAL STOP/BANG "YES" ANSWERS                                                                        For Office Use Only              Procedure Order Form    YES to 3+ Stop Bang questions OR two clinical symptoms - patient qualifies for WatchPAT (CPT 95800)     Submit: This Form + Patient Face Sheet + Clinical Note via CloudPAT or Fax: [phone]         Clinical Notes: Will consult Sleep Specialist and refer for management of therapy due to patient increased risk of Sleep Apnea. Ordering a sleep study due to the following two clinical symptoms: Excessive daytime sleepiness G47.10 / Gastroesophageal reflux K21.9 / Nocturia R35.1 / Morning Headaches G44.221 / Difficulty concentrating R41.840 / Memory problems or poor judgment G31.84 / Personality changes or irritability R45.4 / Loud snoring R06.83 / Depression F32.9 / Unrefreshed by sleep G47.8 / Impotence N52.9 / History of high blood pressure R03.0 / Insomnia G47.00    I understand that I am proceeding with a home sleep apnea test as ordered by my treating physician. I understand that untreated sleep apnea is a serious cardiovascular risk factor and it is my responsibility to perform the test and seek management for sleep  apnea. I will be contacted with the results and be managed for sleep apnea by a local sleep physician. I will be receiving equipment and further instructions from [facility]. I shall promptly ship back the equipment via the included mailing label. I understand my insurance will be billed for the test and as the patient I am responsible for any insurance related out-of-pocket costs incurred. I have been provided with written instructions and can call for additional video or telephonic instruction, with 24-hour availability of qualified personnel to answer any questions: Patient Help Desk [phone].  Patient Signature ______________________________________________________   Date______________________ Patient Telemedicine Verbal Consent

## 2018-12-26 NOTE — Addendum Note (Signed)
Addended by: Willeen Cass A on: 12/26/2018 09:52 AM   Modules accepted: Orders

## 2018-12-30 NOTE — Telephone Encounter (Signed)
FAxed to [employer].

## 2019-01-20 DIAGNOSIS — Z0000 Encounter for general adult medical examination without abnormal findings: Secondary | ICD-10-CM | POA: Diagnosis not present

## 2019-01-20 DIAGNOSIS — Z23 Encounter for immunization: Secondary | ICD-10-CM | POA: Diagnosis not present

## 2019-01-25 ENCOUNTER — Encounter (INDEPENDENT_AMBULATORY_CARE_PROVIDER_SITE_OTHER): Payer: BC Managed Care – PPO | Admitting: Cardiology

## 2019-01-25 DIAGNOSIS — R0683 Snoring: Secondary | ICD-10-CM | POA: Diagnosis not present

## 2019-01-25 DIAGNOSIS — R55 Syncope and collapse: Secondary | ICD-10-CM

## 2019-01-28 ENCOUNTER — Ambulatory Visit: Payer: BC Managed Care – PPO

## 2019-01-28 NOTE — Procedures (Signed)
    Patient Information Name: Hillman Attig ID: [id number] Birth Date: 10-25-1970  Age: 48  Gender: Male Study Date:01/25/2019 Referring Physician:  Cristopher Peru, MD  TEST DESCRIPTION: Home sleep apnea testing was completed using the WatchPat, a Type 1 device, utilizing peripheral arterial tonometry (PAT), chest movement, actigraphy, pulse oximetry, pulse rate, body position and snore. AHI was calculated with apnea and hypopnea using valid sleep time as the denominator. RDI includes apneas, hypopneas, and RERAs. The data acquired and the scoring of sleep and all associated events were performed in accordance with the recommended standards and specifications as outlined in the AASM Manual for the Scoring of Sleep and Associated Events 2.2.0 (2015).  DIAGNOSIS: 1. Normal study with no significant sleep disordered breathing (AHI 2.6/hr). 2. Minimum O2 saturation 89% with no time spent with O2 sats < 88%. 3. Total sleep time 8hrs 35min. 4. Mild to moderate snoring. 5. Prolonged sleep onset latency at 49 min, REM sleep latency at 307 min and reduced % REM sleep at 16.8%.  RECOMMENDATIONS: 1. Normal study with no significant sleep disordered breathing.  2. An ENT consultation which may be useful for specific causes of and possible treatment of bothersome snoring .  3. Weight loss may be of benefit in reducing the severity of snoring .   Sleep Study Report Report prepared by:  Signature: Fransico Him, MD ABSM   Electronically Signed: Jan 28, 2019

## 2019-01-31 ENCOUNTER — Telehealth: Payer: Self-pay | Admitting: *Deleted

## 2019-01-31 NOTE — Telephone Encounter (Signed)
-----   Message from Sueanne Margarita, MD sent at 01/28/2019  9:02 PM EDT ----- Please let patient know that sleep study showed no significant sleep apnea.

## 2019-01-31 NOTE — Telephone Encounter (Signed)
Informed patient of sleep study results and patient understanding was verbalized. Patient understands HIS sleep study showed no significant sleep apnea.  Pt is aware and agreeable to normal results.     

## 2019-02-19 ENCOUNTER — Other Ambulatory Visit: Payer: Self-pay

## 2019-05-06 ENCOUNTER — Other Ambulatory Visit: Payer: Self-pay

## 2019-05-06 DIAGNOSIS — Z20822 Contact with and (suspected) exposure to covid-19: Secondary | ICD-10-CM

## 2019-05-07 LAB — NOVEL CORONAVIRUS, NAA: SARS-CoV-2, NAA: NOT DETECTED

## 2019-05-19 ENCOUNTER — Other Ambulatory Visit: Payer: Self-pay

## 2019-05-19 DIAGNOSIS — Z20822 Contact with and (suspected) exposure to covid-19: Secondary | ICD-10-CM

## 2019-05-20 LAB — NOVEL CORONAVIRUS, NAA: SARS-CoV-2, NAA: NOT DETECTED

## 2021-02-10 ENCOUNTER — Ambulatory Visit (INDEPENDENT_AMBULATORY_CARE_PROVIDER_SITE_OTHER): Payer: 59 | Admitting: Gastroenterology

## 2021-02-10 ENCOUNTER — Encounter: Payer: Self-pay | Admitting: Gastroenterology

## 2021-02-10 VITALS — BP 108/78 | HR 71 | Ht 73.0 in | Wt 223.0 lb

## 2021-02-10 DIAGNOSIS — Z1211 Encounter for screening for malignant neoplasm of colon: Secondary | ICD-10-CM | POA: Diagnosis not present

## 2021-02-10 DIAGNOSIS — K649 Unspecified hemorrhoids: Secondary | ICD-10-CM

## 2021-02-10 DIAGNOSIS — K625 Hemorrhage of anus and rectum: Secondary | ICD-10-CM | POA: Diagnosis not present

## 2021-02-10 DIAGNOSIS — Z800 Family history of malignant neoplasm of digestive organs: Secondary | ICD-10-CM

## 2021-02-10 DIAGNOSIS — K219 Gastro-esophageal reflux disease without esophagitis: Secondary | ICD-10-CM

## 2021-02-10 NOTE — Patient Instructions (Addendum)
You have been scheduled for an endoscopy and colonoscopy. Please follow the written instructions given to you at your visit today. Please pick up your prep supplies at the pharmacy within the next 1-3 days. If you use inhalers (even only as needed), please bring them with you on the day of your procedure.   Follow up as needed after procedures  Due to recent changes in healthcare laws, you may see the results of your imaging and laboratory studies on MyChart before your provider has had a chance to review them.  We understand that in some cases there may be results that are confusing or concerning to you. Not all laboratory results come back in the same time frame and the provider may be waiting for multiple results in order to interpret others.  Please give [place] 48 hours in order for your provider to thoroughly review all the results before contacting the office for clarification of your results.    If you are age 97 or older, your body mass index should be between 23-30. Your Body mass index is 29.42 kg/m. If this is out of the aforementioned range listed, please consider follow up with your Primary Care Provider.  If you are age 42 or younger, your body mass index should be between 19-25. Your Body mass index is 29.42 kg/m. If this is out of the aformentioned range listed, please consider follow up with your Primary Care Provider.   __________________________________________________________  The [facility] providers would like to encourage you to use [facility] to communicate with providers for non-urgent requests or questions.  Due to long hold times on the telephone, sending your provider a message by [facility] may be a faster and more efficient way to get a response.  Please allow 48 business hours for a response.  Please remember that this is for non-urgent requests.    I appreciate the  opportunity to care for you  Thank You   Marsa Aris , MD

## 2021-02-10 NOTE — Progress Notes (Signed)
Jerry Carey    [id number]    04/23/1971  Primary Care Physician:Pharr, Zollie Beckers, MD  Referring Physician: Merri Brunette, MD [address] [place],  [place] [address]   Chief complaint:  Rectal bleeding, GERD  HPI: 50 year old very pleasant gentleman here for new patient visit with complaints of GERD symptoms and intermittent bright red blood per rectum He has been having on and off intermittent bright red blood per rectum mostly after a bowel movement when he wipes.  Denies any pain or discomfort during defecation was treated for anal fissure many years ago and also was told that he has internal and external hemorrhoids. He has had multiple colonoscopies in the past since his 75s, also had polyps removed.  Prior colonoscopy reports are not available during this visit.  His last colonoscopy was over 10 years ago. His first cousin was diagnosed with esophageal cancer in his 46s.  He is taking Pepcid 40 mg daily.  He has intermittent breakthrough heartburn and reflux symptoms.  Denies dysphagia or odynophagia. No recent change in bowel habits or abdominal pain. No family history of colon cancer.    Outpatient Encounter Medications as of 02/10/2021  Medication Sig   famotidine (PEPCID) 40 MG tablet 1 tablet at bedtime.   [DISCONTINUED] metoprolol tartrate (LOPRESSOR) 100 MG tablet Take 1 tablet (100 mg total) by mouth once for 1 dose. Take one tablet 2 hours prior to test   No facility-administered encounter medications on file as of 02/10/2021.    Allergies as of 02/10/2021 - Review Complete 02/10/2021  Allergen Reaction Noted   Chlorhexidine gluconate Rash 06/23/2015    Past Medical History:  Diagnosis Date   Abscess of left shoulder 06/24/2015   Coagulase-negative staphylococcal infection 07/12/2015   left shoulder wound   Complication of anesthesia    woke up angry and aggitated   GERD (gastroesophageal reflux disease)    Infection    left  shoulder wound   Pectoralis muscle rupture    left   Pneumonia    Wears glasses     Past Surgical History:  Procedure Laterality Date   APPLICATION OF WOUND VAC  07/15/2015   I & D EXTREMITY Left 07/21/2015   Procedure: IRRIGATION AND DEBRIDEMENT EXTREMITY; LEFT SHOULDER;  Surgeon: Salvatore Marvel, MD;  [facility];  Service: Orthopedics;  Laterality: Left;   INCISION AND DRAINAGE Left 06/24/2015   Procedure: INCISION AND DRAINAGE LEFT PECTORALIS MUSCLE;  Surgeon: Teryl Lucy, MD;  Location: [facility];  Service: Orthopedics;  Laterality: Left;   INCISION AND DRAINAGE ABSCESS Left 07/15/2015   Procedure: OPEN INCISION AND DRAINAGE LEFT SHOULDER ;  Surgeon: Salvatore Marvel, MD;  Location: [facility];  Service: Orthopedics;  Laterality: Left;   IRRIGATION AND DEBRIDEMENT ABSCESS Left 07/15/2015   LEFT SHOULDER   IRRIGATION AND DEBRIDEMENT SHOULDER Left 07/18/2015   Procedure: IRRIGATION AND DEBRIDEMENT , VERAFLO WOUND VAC PLACEMENT;  Surgeon: Myrene Galas, MD;  [facility];  Service: Orthopedics;  Laterality: Left;   PECTORALIS TENDON REPAIR Left 06/03/2015   Procedure: LEFT PECTORALIS TENDON REPAIR;  Surgeon: Salvatore Marvel, MD;  Location: [facility];  Service: Orthopedics;  Laterality: Left;   TONSILLECTOMY     WISDOM TOOTH EXTRACTION      Family History  Problem Relation Age of Onset   Hypothyroidism Mother    Meniere's disease Mother    Parkinson's disease Father    Colon cancer Paternal Uncle  Pancreatic cancer Neg Hx    Liver cancer Neg Hx     Social History   Socioeconomic History   Marital status: Married    Spouse name: Not on file   Number of children: Not on file   Years of education: Not on file   Highest education level: Not on file  Occupational History   Not on file  Tobacco Use   Smoking status: Former    Types: Cigarettes    Quit date: 11/28/2008    Years since quitting: 12.2   Smokeless tobacco: Never  Substance and Sexual  Activity   Alcohol use: Yes    Comment: social   Drug use: Yes    Types: Marijuana   Sexual activity: Not on file  Other Topics Concern   Not on file  Social History Narrative   Not on file   Social Determinants of Health   Financial Resource Strain: Not on file  Food Insecurity: Not on file  Transportation Needs: Not on file  Physical Activity: Not on file  Stress: Not on file  Social Connections: Not on file  Intimate Partner Violence: Not on file      Review of systems: All other review of systems negative except as mentioned in the HPI.   Physical Exam: Vitals:   02/10/21 1442  BP: 108/78  Pulse: 71  SpO2: 96%   Body mass index is 29.42 kg/m. Gen:      No acute distress HEENT:  sclera anicteric Abd:      soft, non-tender; no palpable masses, no distension Ext:    No edema Neuro: alert and oriented x 3 Psych: normal mood and affect Rectal exam deferred  Data Reviewed:  Reviewed labs, radiology imaging, old records and pertinent past GI work up   Assessment and Plan/Recommendations:  50 year old very pleasant gentleman with complaints of worsening reflux symptoms and intermittent bright red blood per rectum Family history positive for esophageal cancer and he has remote history of smoking We will plan to schedule EGD for further evaluation, exclude erosive esophagitis or Barrett's esophagus  Intermittent bright red blood per rectum, most likely etiology small-volume bleeding from internal hemorrhoids but will need to exclude neoplastic lesion or bleeding polyp He is due for colorectal cancer screening Schedule for colonoscopy for further evaluation Will consider hemorrhoidal band ligation after colonoscopy if no other etiology for rectal bleeding  The risks and benefits as well as alternatives of endoscopic procedure(s) have been discussed and reviewed. All questions answered. The patient agrees to proceed.  The patient was provided an opportunity to  ask questions and all were answered. The patient agreed with the plan and demonstrated an understanding of the instructions.  Iona Beard , MD    CC: Merri Brunette, MD

## 2021-03-23 ENCOUNTER — Encounter: Payer: Self-pay | Admitting: Gastroenterology

## 2021-03-30 ENCOUNTER — Encounter: Payer: Self-pay | Admitting: Gastroenterology

## 2021-03-30 ENCOUNTER — Other Ambulatory Visit: Payer: Self-pay

## 2021-03-30 ENCOUNTER — Ambulatory Visit (AMBULATORY_SURGERY_CENTER): Payer: 59 | Admitting: Gastroenterology

## 2021-03-30 ENCOUNTER — Telehealth: Payer: Self-pay

## 2021-03-30 ENCOUNTER — Telehealth: Payer: Self-pay | Admitting: Gastroenterology

## 2021-03-30 VITALS — BP 117/72 | HR 82 | Temp 98.9°F | Resp 14 | Ht 73.0 in | Wt 223.0 lb

## 2021-03-30 DIAGNOSIS — K2100 Gastro-esophageal reflux disease with esophagitis, without bleeding: Secondary | ICD-10-CM

## 2021-03-30 DIAGNOSIS — K449 Diaphragmatic hernia without obstruction or gangrene: Secondary | ICD-10-CM

## 2021-03-30 DIAGNOSIS — K922 Gastrointestinal hemorrhage, unspecified: Secondary | ICD-10-CM | POA: Diagnosis not present

## 2021-03-30 DIAGNOSIS — K2210 Ulcer of esophagus without bleeding: Secondary | ICD-10-CM | POA: Diagnosis not present

## 2021-03-30 DIAGNOSIS — K209 Esophagitis, unspecified without bleeding: Secondary | ICD-10-CM

## 2021-03-30 DIAGNOSIS — K5730 Diverticulosis of large intestine without perforation or abscess without bleeding: Secondary | ICD-10-CM | POA: Diagnosis not present

## 2021-03-30 DIAGNOSIS — K219 Gastro-esophageal reflux disease without esophagitis: Secondary | ICD-10-CM

## 2021-03-30 DIAGNOSIS — K602 Anal fissure, unspecified: Secondary | ICD-10-CM

## 2021-03-30 DIAGNOSIS — K625 Hemorrhage of anus and rectum: Secondary | ICD-10-CM

## 2021-03-30 MED ORDER — AMBULATORY NON FORMULARY MEDICATION: 0 refills | Status: AC

## 2021-03-30 MED ORDER — ESOMEPRAZOLE MAGNESIUM 40 MG PO CPDR: 40.0000 mg | DELAYED_RELEASE_CAPSULE | Freq: Every day | ORAL | 0 refills | Status: DC

## 2021-03-30 MED ORDER — SODIUM CHLORIDE 0.9 % IV SOLN: 500.0000 mL | Freq: Once | INTRAVENOUS | Status: DC

## 2021-03-30 NOTE — Progress Notes (Signed)
[facility] History and Physical   Primary Care Physician:  Merri Brunette, MD   Reason for Procedure:  Recurrent and persistent GERD symptoms despite therapy, rectal bleeding  Plan:    EGD and colonoscopy with possible interventions as needed     HPI: Jerry Carey is a very pleasant 50 y.o. male here for EGD and colonoscopy. Denies any nausea, vomiting, abdominal pain, or melena.   The risks and benefits as well as alternatives of endoscopic procedure(s) have been discussed and reviewed. All questions answered. The patient agrees to proceed.  Please refer to office note 02/10/21 for additional details  Past Medical History:  Diagnosis Date   Abscess of left shoulder 06/24/2015   Coagulase-negative staphylococcal infection 07/12/2015   left shoulder wound   Complication of anesthesia    woke up angry and aggitated   GERD (gastroesophageal reflux disease)    Infection    left shoulder wound   Pectoralis muscle rupture    left   Pneumonia    Wears glasses     Past Surgical History:  Procedure Laterality Date   APPLICATION OF WOUND VAC  07/15/2015   I & D EXTREMITY Left 07/21/2015   Procedure: IRRIGATION AND DEBRIDEMENT EXTREMITY; LEFT SHOULDER;  Surgeon: Salvatore Marvel, MD;  [facility];  Service: Orthopedics;  Laterality: Left;   INCISION AND DRAINAGE Left 06/24/2015   Procedure: INCISION AND DRAINAGE LEFT PECTORALIS MUSCLE;  Surgeon: Teryl Lucy, MD;  Location: [facility];  Service: Orthopedics;  Laterality: Left;   INCISION AND DRAINAGE ABSCESS Left 07/15/2015   Procedure: OPEN INCISION AND DRAINAGE LEFT SHOULDER ;  Surgeon: Salvatore Marvel, MD;  Location: [facility] OR;  Service: Orthopedics;  Laterality: Left;   IRRIGATION AND DEBRIDEMENT ABSCESS Left 07/15/2015   LEFT SHOULDER   IRRIGATION AND DEBRIDEMENT SHOULDER Left 07/18/2015   Procedure: IRRIGATION AND DEBRIDEMENT , VERAFLO WOUND VAC PLACEMENT;  Surgeon: Myrene Galas, MD;  [facility];  Service: Orthopedics;  Laterality: Left;   PECTORALIS TENDON REPAIR Left 06/03/2015   Procedure: LEFT PECTORALIS TENDON REPAIR;  Surgeon: Salvatore Marvel, MD;  Location: [facility];  Service: Orthopedics;  Laterality: Left;   TONSILLECTOMY     WISDOM TOOTH EXTRACTION      Prior to Admission medications   Medication Sig Start Date End Date Taking? Authorizing Provider  famotidine (PEPCID) 40 MG tablet 1 tablet at bedtime. 02/23/20   [provider]    Current Outpatient Medications  Medication Sig Dispense Refill   famotidine (PEPCID) 40 MG tablet 1 tablet at bedtime.     No current facility-administered medications for this visit.    Allergies as of 03/30/2021 - Review Complete 02/10/2021  Allergen Reaction Noted   Chlorhexidine gluconate Rash 06/23/2015    Family History  Problem Relation Age of Onset   Hypothyroidism Mother    Meniere's disease Mother    Parkinson's disease Father    Colon cancer Paternal Uncle    Pancreatic cancer Neg Hx    Liver cancer Neg Hx     Social History   Socioeconomic History   Marital status: Married    Spouse name: Not on file   Number of children: Not on file   Years of education: Not on file   Highest education level: Not on file  Occupational History   Not on file  Tobacco Use   Smoking status: Former    Types: Cigarettes    Quit date: 11/28/2008    Years since quitting: 12.3  Smokeless tobacco: Never  Substance and Sexual Activity   Alcohol use: Yes    Comment: social   Drug use: Yes    Types: Marijuana   Sexual activity: Not on file  Other Topics Concern   Not on file  Social History Narrative   Not on file   Social Determinants of Health   Financial Resource Strain: Not on file  Food Insecurity: Not on file  Transportation Needs: Not on file  Physical Activity: Not on file  Stress: Not on file  Social Connections: Not on file  Intimate Partner Violence: Not on file    Review of  Systems:  All other review of systems negative except as mentioned in the HPI.  Physical Exam: Vital signs in last 24 hours: BP 107/80   Pulse 73   Temp 98.9 F (37.2 C)   Resp 13   Ht 6\' 1"  (1.854 m)   Wt 223 lb (101.2 kg)   SpO2 99%   BMI 29.42 kg/m     General:   Alert, NAD Lungs:  Clear .   Heart:  Regular rate and rhythm Abdomen:  Soft, nontender and nondistended. Neuro/Psych:  Alert and cooperative. Normal mood and affect. A and O x 3  Reviewed labs, radiology imaging, old records and pertinent past GI work up  Patient is appropriate for planned procedure(s) and anesthesia in an ambulatory setting   K. , MD [phone]

## 2021-03-30 NOTE — Telephone Encounter (Signed)
Called patient and he told me there was a mix up with the prescription. I called the pharmacy and they had questions about the medication and instructions because it was not listed on the paper prescription. The correct drug, dose and directions were relayed to the pharmacist and she said she would call the patient back with a pick up time.

## 2021-03-30 NOTE — Op Note (Signed)
[facility] Patient Name: Jerry Carey Procedure Date: 03/30/2021 1:30 PM MRN: [id number] Endoscopist: Napoleon Form , MD Age: 50 Referring MD:  Date of Birth: 21-Mar-1971 Gender: Male Account #: [id number] Procedure:                Upper GI endoscopy Indications:              Esophageal reflux symptoms that persist despite                            appropriate therapy, Esophageal reflux symptoms                            that recur despite appropriate therapy Medicines:                Monitored Anesthesia Care Procedure:                Pre-Anesthesia Assessment:                           - Prior to the procedure, a History and Physical                            was performed, and patient medications and                            allergies were reviewed. The patient's tolerance of                            previous anesthesia was also reviewed. The risks                            and benefits of the procedure and the sedation                            options and risks were discussed with the patient.                            All questions were answered, and informed consent                            was obtained. Prior Anticoagulants: The patient has                            taken no previous anticoagulant or antiplatelet                            agents. ASA Grade Assessment: II - A patient with                            mild systemic disease. After reviewing the risks                            and benefits, the patient was deemed in  satisfactory condition to undergo the procedure.                           After obtaining informed consent, the endoscope was                            passed under direct vision. Throughout the                            procedure, the patient's blood pressure, pulse, and                            oxygen saturations were monitored continuously. The                            Endoscope was  introduced through the mouth, and                            advanced to the second part of duodenum. The upper                            GI endoscopy was accomplished without difficulty.                            The patient tolerated the procedure well. Scope In: Scope Out: Findings:                 LA Grade C (one or more mucosal breaks continuous                            between tops of 2 or more mucosal folds, less than                            75% circumference) esophagitis with no bleeding was                            found 38 to 40 cm from the incisors. Biopsies were                            taken with a cold forceps for histology.                           Few linear and superficial esophageal ulcers with                            no bleeding and no stigmata of recent bleeding were                            found 38 to 40 cm from the incisors. The largest                            lesion was 3 mm in largest dimension.  A small hiatal hernia was present.                           The stomach was normal.                           The cardia and gastric fundus were normal on                            retroflexion.                           The examined duodenum was normal. Complications:            No immediate complications. Estimated Blood Loss:     Estimated blood loss was minimal. Impression:               - LA Grade C reflux esophagitis with no bleeding.                            Biopsied.                           - Esophageal ulcers with no bleeding and no                            stigmata of recent bleeding.                           - Small hiatal hernia.                           - Normal stomach.                           - Normal examined duodenum. Recommendation:           - Resume previous diet.                           - Continue present medications.                           - Await pathology results.                            - Follow an antireflux regimen.                           - Use Nexium (esomeprazole) 40 mg PO daily for 3                            months.                           - Return to GI office in 2 months. Please call to                            schedule appointment [facility]  Beverely Risen, MD 03/30/2021 2:09:08 PM This report has been signed electronically.

## 2021-03-30 NOTE — Telephone Encounter (Signed)
PHARMACY REP CALLED INDICATING THAT THE GEL MEDICATION ORDER IS INCOMPLETE.

## 2021-03-30 NOTE — Progress Notes (Signed)
Pt Drowsy. VSS. To PACU, report to RN. No anesthetic complications noted.  

## 2021-03-30 NOTE — Progress Notes (Signed)
Called to room to assist during endoscopic procedure.  Patient ID and intended procedure confirmed with present staff. Received instructions for my participation in the procedure from the performing physician.  

## 2021-03-30 NOTE — Op Note (Signed)
[facility] Patient Name: Jerry Carey Procedure Date: 03/30/2021 1:30 PM MRN: [id number] Endoscopist: Napoleon Form , MD Age: 50 Referring MD:  Date of Birth: 1970-10-31 Gender: Male Account #: [id number] Procedure:                Colonoscopy Indications:              Evaluation of unexplained GI bleeding presenting                            with Hematochezia Medicines:                Monitored Anesthesia Care Procedure:                Pre-Anesthesia Assessment:                           - Prior to the procedure, a History and Physical                            was performed, and patient medications and                            allergies were reviewed. The patient's tolerance of                            previous anesthesia was also reviewed. The risks                            and benefits of the procedure and the sedation                            options and risks were discussed with the patient.                            All questions were answered, and informed consent                            was obtained. Prior Anticoagulants: The patient has                            taken no previous anticoagulant or antiplatelet                            agents. ASA Grade Assessment: II - A patient with                            mild systemic disease. After reviewing the risks                            and benefits, the patient was deemed in                            satisfactory condition to undergo the procedure.  After obtaining informed consent, the colonoscope                            was passed under direct vision. Throughout the                            procedure, the patient's blood pressure, pulse, and                            oxygen saturations were monitored continuously. The                            Olympus PCF-H190DL (#[id number]) Colonoscope was                            introduced through the anus and advanced  to the the                            cecum, identified by appendiceal orifice and                            ileocecal valve. The colonoscopy was performed                            without difficulty. The patient tolerated the                            procedure well. The quality of the bowel                            preparation was excellent. The ileocecal valve,                            appendiceal orifice, and rectum were photographed. Scope In: 1:43:07 PM Scope Out: 2:00:15 PM Scope Withdrawal Time: 0 hours 7 minutes 16 seconds  Total Procedure Duration: 0 hours 17 minutes 8 seconds  Findings:                 The perianal and digital rectal examinations were                            normal.                           Non-bleeding external and internal hemorrhoids were                            found during retroflexion. The hemorrhoids were                            medium-sized.                           A less than 5 mm anal fissure was found in the anal  canal. Likely etiology of rectal bleeding                           Scattered small-mouthed diverticula were found in                            the sigmoid colon.                           The exam was otherwise without abnormality. Complications:            No immediate complications. Estimated Blood Loss:     Estimated blood loss was minimal. Impression:               - Non-bleeding external and internal hemorrhoids.                           - Anal fissure.                           - Diverticulosis in the sigmoid colon.                           - The examination was otherwise normal.                           - No specimens collected. Recommendation:           - Patient has a contact number available for                            emergencies. The signs and symptoms of potential                            delayed complications were discussed with the                            patient.  Return to normal activities tomorrow.                            Written discharge instructions were provided to the                            patient.                           - Resume previous diet.                           - Continue present medications.                           - Repeat colonoscopy in 10 years for screening                            purposes.                           -  Use Benefiber two caplets PO BID.                           - Use dilatizem 2% gel, small pea size amount per                            rectum three times daily X 6-8 weeks Napoleon Form, MD 03/30/2021 2:12:02 PM This report has been signed electronically.

## 2021-03-30 NOTE — Patient Instructions (Signed)
Upper-Resume previous diet and present medications. Awaiting pathology results. Follow an antireflux regimen. Use Nexium 40 mg PO daily for 3 months. Return to GI office in 2 months.  Lower-Repeat Colonoscopy in 10 years for screening purposes. YOU HAD AN ENDOSCOPIC PROCEDURE TODAY AT THE [facility]:   Refer to the procedure report that was given to you for any specific questions about what was found during the examination.  If the procedure report does not answer your questions, please call your gastroenterologist to clarify.  If you requested that your care partner not be given the details of your procedure findings, then the procedure report has been included in a sealed envelope for you to review at your convenience later.  YOU SHOULD EXPECT: Some feelings of bloating in the abdomen. Passage of more gas than usual.  Walking can help get rid of the air that was put into your GI tract during the procedure and reduce the bloating. If you had a lower endoscopy (such as a colonoscopy or flexible sigmoidoscopy) you may notice spotting of blood in your stool or on the toilet paper. If you underwent a bowel prep for your procedure, you may not have a normal bowel movement for a few days.  Please Note:  You might notice some irritation and congestion in your nose or some drainage.  This is from the oxygen used during your procedure.  There is no need for concern and it should clear up in a day or so.  SYMPTOMS TO REPORT IMMEDIATELY:  Following lower endoscopy (colonoscopy or flexible sigmoidoscopy):  Excessive amounts of blood in the stool  Significant tenderness or worsening of abdominal pains  Swelling of the abdomen that is new, acute  Fever of 100F or higher  Following upper endoscopy (EGD)  Vomiting of blood or coffee ground material  New chest pain or pain under the shoulder blades  Painful or persistently difficult swallowing  New shortness of breath  Fever of 100F or  higher  Black, tarry-looking stools  For urgent or emergent issues, a gastroenterologist can be reached at any hour by calling [phone]. Do not use MyChart messaging for urgent concerns.    DIET:  We do recommend a small meal at first, but then you may proceed to your regular diet.  Drink plenty of fluids but you should avoid alcoholic beverages for 24 hours.  ACTIVITY:  You should plan to take it easy for the rest of today and you should NOT DRIVE or use heavy machinery until tomorrow (because of the sedation medicines used during the test).    FOLLOW UP: Our staff will call the number listed on your records 48-72 hours following your procedure to check on you and address any questions or concerns that you may have regarding the information given to you following your procedure. If we do not reach you, we will leave a message.  We will attempt to reach you two times.  During this call, we will ask if you have developed any symptoms of COVID 19. If you develop any symptoms (ie: fever, flu-like symptoms, shortness of breath, cough etc.) before then, please call [phone].  If you test positive for Covid 19 in the 2 weeks post procedure, please call and report this information to [place].    If any biopsies were taken you will be contacted by phone or by letter within the next 1-3 weeks.  Please call us at [phone] if you have not heard about the biopsies in  3 weeks.    SIGNATURES/CONFIDENTIALITY: You and/or your care partner have signed paperwork which will be entered into your electronic medical record.  These signatures attest to the fact that that the information above on your After Visit Summary has been reviewed and is understood.  Full responsibility of the confidentiality of this discharge information lies with you and/or your care-partner.

## 2021-03-30 NOTE — Progress Notes (Signed)
VS by Banks. 

## 2021-04-01 ENCOUNTER — Telehealth: Payer: Self-pay

## 2021-04-01 NOTE — Telephone Encounter (Signed)
  Follow up Call-  Call back number 03/30/2021  Post procedure Call Back phone  # [phone]  Permission to leave phone message Yes  Some recent data might be hidden     Patient questions:  Do you have a fever, pain , or abdominal swelling? No. Pain Score  0 *  Have you tolerated food without any problems? Yes.    Have you been able to return to your normal activities? Yes.    Do you have any questions about your discharge instructions: Diet   No. Medications  No. Follow up visit  No.  Do you have questions or concerns about your Care? No.  Actions: * If pain score is 4 or above: No action needed, pain <4.

## 2021-04-20 ENCOUNTER — Ambulatory Visit: Payer: 59 | Admitting: Student

## 2021-04-21 ENCOUNTER — Encounter: Payer: Self-pay | Admitting: Gastroenterology

## 2021-06-23 ENCOUNTER — Other Ambulatory Visit: Payer: Self-pay | Admitting: Gastroenterology

## 2021-06-23 DIAGNOSIS — K2210 Ulcer of esophagus without bleeding: Secondary | ICD-10-CM

## 2021-06-23 DIAGNOSIS — K625 Hemorrhage of anus and rectum: Secondary | ICD-10-CM

## 2021-06-23 DIAGNOSIS — K602 Anal fissure, unspecified: Secondary | ICD-10-CM

## 2021-06-23 DIAGNOSIS — K449 Diaphragmatic hernia without obstruction or gangrene: Secondary | ICD-10-CM

## 2021-06-23 DIAGNOSIS — K219 Gastro-esophageal reflux disease without esophagitis: Secondary | ICD-10-CM

## 2021-06-23 DIAGNOSIS — K209 Esophagitis, unspecified without bleeding: Secondary | ICD-10-CM

## 2021-08-08 ENCOUNTER — Other Ambulatory Visit: Payer: Self-pay

## 2021-08-08 ENCOUNTER — Telehealth: Payer: Self-pay | Admitting: Gastroenterology

## 2021-08-08 DIAGNOSIS — K209 Esophagitis, unspecified without bleeding: Secondary | ICD-10-CM

## 2021-08-08 DIAGNOSIS — K2210 Ulcer of esophagus without bleeding: Secondary | ICD-10-CM

## 2021-08-08 DIAGNOSIS — K219 Gastro-esophageal reflux disease without esophagitis: Secondary | ICD-10-CM

## 2021-08-08 DIAGNOSIS — K449 Diaphragmatic hernia without obstruction or gangrene: Secondary | ICD-10-CM

## 2021-08-08 DIAGNOSIS — K625 Hemorrhage of anus and rectum: Secondary | ICD-10-CM

## 2021-08-08 DIAGNOSIS — K602 Anal fissure, unspecified: Secondary | ICD-10-CM

## 2021-08-08 MED ORDER — ESOMEPRAZOLE MAGNESIUM 40 MG PO CPDR: 40.0000 mg | DELAYED_RELEASE_CAPSULE | Freq: Every day | ORAL | 1 refills | Status: DC

## 2021-08-08 NOTE — Telephone Encounter (Signed)
Spoke with the spouse. She is calling with patient's permission to relay information. Patient is taking OTC Omeprazole now. Ran out of the prescription Omeprazole 40 mg. His reflux has become so bad he is vomiting. She also says he is still having rectal blood. She reports he was "not comfortable with using the ointment knowing him" and he stopped using it. She is not sure if he is taking famotidine at all.  Please advise.

## 2021-08-08 NOTE — Telephone Encounter (Signed)
Spoke with the spouse. Advised of the plan. Appointment scheduled.

## 2021-08-08 NOTE — Telephone Encounter (Signed)
Please send prescription for omeprazole 40 mg daily with 1 refill.  Please arrange for GI office follow-up visit soon.  We will plan for further evaluation of rectal bleeding and management at the time.  Thanks

## 2021-08-08 NOTE — Telephone Encounter (Signed)
Inbound call from patient's wife stating omeprazole medication is not helping and is wanting to know if something else can be prescribed.  Please advise.

## 2021-08-09 ENCOUNTER — Ambulatory Visit: Payer: 59 | Admitting: Gastroenterology

## 2021-09-21 ENCOUNTER — Encounter: Payer: Self-pay | Admitting: Physician Assistant

## 2021-09-26 ENCOUNTER — Ambulatory Visit: Payer: 59 | Admitting: Physician Assistant

## 2022-01-30 ENCOUNTER — Other Ambulatory Visit: Payer: Self-pay | Admitting: Internal Medicine

## 2022-02-06 ENCOUNTER — Other Ambulatory Visit: Payer: Self-pay | Admitting: Internal Medicine

## 2022-02-06 ENCOUNTER — Ambulatory Visit (INDEPENDENT_AMBULATORY_CARE_PROVIDER_SITE_OTHER): Payer: 59

## 2022-02-06 ENCOUNTER — Ambulatory Visit: Payer: Self-pay

## 2022-02-06 ENCOUNTER — Ambulatory Visit (INDEPENDENT_AMBULATORY_CARE_PROVIDER_SITE_OTHER): Payer: 59 | Admitting: Family Medicine

## 2022-02-06 VITALS — BP 120/84 | HR 82 | Ht 73.0 in | Wt 230.0 lb

## 2022-02-06 DIAGNOSIS — I519 Heart disease, unspecified: Secondary | ICD-10-CM

## 2022-02-06 DIAGNOSIS — M79672 Pain in left foot: Secondary | ICD-10-CM

## 2022-02-06 NOTE — Progress Notes (Unsigned)
    Subjective:   I, Jerry Carey, am serving as a Neurosurgeon for Dr. Earma Reading.  CC: left foot pain  HPI: Patient is a 51 year old male presenting with Left foot pain going on for about 3 weeks not aware of any MOI. Patient has only been keeping a sneaker on to help with the compression. Locates pain to top of left foot.  Hurts less in a sneaker with the compression than if he is walking barefoot or in a slip on shoe. Hurts worse when walking and very tender to the touch, not heat, not swollen.  Pertinent review of Systems: No fevers or chills  Relevant historical information: History of a left pectoralis muscle rupture.    Objective:    Vitals:   02/06/22 1531  BP: 120/84  Pulse: 82  SpO2: 96%   General: Well Developed, well nourished, and in no acute distress.   MSK: Left foot swelling along dorsal forefoot. Tender palpation at second metatarsal shaft. Normal foot and ankle motion. Pulses cap refill and sensation are intact distally. Strength is intact distally.  Lab and Radiology Results  Diagnostic Limited MSK Ultrasound of: Left foot Small amount of hypoechoic change and increased vascular activity on Doppler seen overlying second metatarsal shaft. Impression: Concern for stress fracture second metatarsal shaft.  X-ray images left foot obtained today personally and independently interpreted No acute fractures or significant visible stress fractures are present along the metatarsal shafts. Await formal radiology review   Impression and Recommendations:    Assessment and Plan: 51 y.o. male with left foot pain and swelling concerning on physical exam and ultrasound examination for second metatarsal shaft stress fracture or stress reaction.  Plan to treat clinically with postop shoe turf toe insole and recheck in 4 weeks.  PDMP not reviewed this encounter. Orders Placed This Encounter  Procedures   [place] LIMITED JOINT SPACE STRUCTURES LOW LEFT(NO LINKED CHARGES)     Standing Status:   Future    Number of Occurrences:   1    Standing Expiration Date:   02/07/2023    Order Specific Question:   Reason for Exam (SYMPTOM  OR DIAGNOSIS REQUIRED)    Answer:   left foot pain    Order Specific Question:   Preferred imaging location?    Answer:   [facility] Foot Complete Left    Standing Status:   Future    Number of Occurrences:   1    Standing Expiration Date:   02/07/2023    Order Specific Question:   Reason for Exam (SYMPTOM  OR DIAGNOSIS REQUIRED)    Answer:   eval foot pain. Suspect Metatarsal stress fracture    Order Specific Question:   Preferred imaging location?    Answer:   [facility]   No orders of the defined types were placed in this encounter.   Discussed warning signs or symptoms. Please see discharge instructions. Patient expresses understanding.   The above documentation has been reviewed and is accurate and complete Clementeen Graham, M.D.

## 2022-02-06 NOTE — Patient Instructions (Addendum)
Good to see you   Please go to [facility] supply to get the post op shoe we talked about today. You may also be able to get it from [employer].  Get a Steel Turf Toe insole.  Do a Microbiologist for [employer]       Follow up in 4 weeks.   Let me know if this not working.

## 2022-02-08 NOTE — Progress Notes (Signed)
Left foot x-ray shows no acute fractures visible.  However a stress fracture is still possible.  MRI would be more accurate but we will try to treat for now.

## 2022-03-06 ENCOUNTER — Ambulatory Visit: Payer: 59 | Admitting: Family Medicine

## 2022-03-06 NOTE — Progress Notes (Deleted)
   I, Philbert Riser, LAT, ATC acting as a scribe for Clementeen Graham, MD.  Jerry Carey is a 51 y.o. male who presents to [facility] today for f/u L foot pain concerning for 2nd MT stress fx/reaction. Pt was last seen by Dr. Denyse Amass on 02/06/22 and was advised to treat w/ a postop shoe turf toe insole. Today, pt reports   Dx imaging: 02/06/22 L foot XR  Pertinent review of systems: ***  Relevant historical information: ***   Exam:  There were no vitals taken for this visit. General: Well Developed, well nourished, and in no acute distress.   MSK: ***    Lab and Radiology Results No results found for this or any previous visit (from the past 72 hour(s)). No results found.     Assessment and Plan: 51 y.o. male with ***   PDMP not reviewed this encounter. No orders of the defined types were placed in this encounter.  No orders of the defined types were placed in this encounter.    Discussed warning signs or symptoms. Please see discharge instructions. Patient expresses understanding.   ***

## 2022-03-07 ENCOUNTER — Ambulatory Visit (INDEPENDENT_AMBULATORY_CARE_PROVIDER_SITE_OTHER): Payer: 59 | Admitting: Family Medicine

## 2022-03-07 ENCOUNTER — Ambulatory Visit (INDEPENDENT_AMBULATORY_CARE_PROVIDER_SITE_OTHER): Payer: 59

## 2022-03-07 VITALS — BP 128/82 | HR 74 | Ht 73.0 in | Wt 230.0 lb

## 2022-03-07 DIAGNOSIS — M79672 Pain in left foot: Secondary | ICD-10-CM

## 2022-03-07 NOTE — Patient Instructions (Addendum)
Thank you for coming in today.   Please get an Xray today before you leave   Continue the shoe for another 2 weeks and then wean out of the shoe.   Recheck in 1 month.   Get a Steel Turf Toe insole.  Do a Microbiologist for [employer]

## 2022-03-07 NOTE — Progress Notes (Signed)
   I, Philbert Riser, LAT, ATC acting as a scribe for Clementeen Graham, MD.  Jerry Carey is a 51 y.o. male who presents to [facility] at [facility] today for f/u L foot pain concerning for 2nd MT stress fx/reaction. Pt was last seen by Dr. Denyse Amass on 02/06/22 and was advised to treat w/ a postop shoe. Today, pt reports has been compliant in wearing the post-op shoe. Pt feels like the pain in his L foot has improved greatly.   Dx imaging: 02/06/22 L foot XR   Pertinent review of systems: No fevers or chills  Relevant historical information: His father is currently in hospice dying in his 63s with Parkinson's disease. Personal history GERD  Exam:  BP 128/82   Pulse 74   Ht 6\' 1"  (1.854 m)   Wt 230 lb (104.3 kg)   SpO2 96%   BMI 30.34 kg/m  General: Well Developed, well nourished, and in no acute distress.   MSK: Left foot normal appearing Tender palpation minimally distal dorsal second metatarsal shaft. Normal foot and ankle motion left side.    Lab and Radiology Results  X-ray images left foot obtained today personally and independently interpreted No acute fractures or callus formation around the second metatarsal shaft.  X-ray is not significantly changed from prior x-ray February 06, 2022. Await formal radiology review    Assessment and Plan: 51 y.o. male with left foot second metatarsal shaft pain thought to be due to acute stress reaction or stress fracture.  Plan to continue the postop shoe for another 2 weeks and then wean out of the shoe.  He can also use turf toe insoles.  We spent time talking about foot wear especially with an anticipated funeral for his father in the near future. Recheck in about a month.   PDMP not reviewed this encounter. Orders Placed This Encounter  Procedures   DG Foot Complete Left    Standing Status:   Future    Number of Occurrences:   1    Standing Expiration Date:   03/08/2023    Order Specific Question:   Reason for  Exam (SYMPTOM  OR DIAGNOSIS REQUIRED)    Answer:   eval stress fracture 2nd metatarsal    Order Specific Question:   Preferred imaging location?    Answer:   05/08/2023   No orders of the defined types were placed in this encounter.    Discussed warning signs or symptoms. Please see discharge instructions. Patient expresses understanding.   The above documentation has been reviewed and is accurate and complete Kyra Searles, M.D.

## 2022-03-08 NOTE — Progress Notes (Signed)
Left foot x-ray looks normal to radiology.

## 2022-03-31 ENCOUNTER — Other Ambulatory Visit: Payer: Self-pay

## 2022-03-31 ENCOUNTER — Ambulatory Visit (INDEPENDENT_AMBULATORY_CARE_PROVIDER_SITE_OTHER): Payer: 59 | Admitting: Gastroenterology

## 2022-03-31 ENCOUNTER — Encounter: Payer: Self-pay | Admitting: Gastroenterology

## 2022-03-31 ENCOUNTER — Telehealth: Payer: Self-pay | Admitting: Gastroenterology

## 2022-03-31 VITALS — BP 122/82 | HR 97 | Ht 73.0 in | Wt 233.0 lb

## 2022-03-31 DIAGNOSIS — K645 Perianal venous thrombosis: Secondary | ICD-10-CM

## 2022-03-31 DIAGNOSIS — K648 Other hemorrhoids: Secondary | ICD-10-CM | POA: Diagnosis not present

## 2022-03-31 DIAGNOSIS — K2210 Ulcer of esophagus without bleeding: Secondary | ICD-10-CM

## 2022-03-31 DIAGNOSIS — K219 Gastro-esophageal reflux disease without esophagitis: Secondary | ICD-10-CM | POA: Diagnosis not present

## 2022-03-31 DIAGNOSIS — K625 Hemorrhage of anus and rectum: Secondary | ICD-10-CM

## 2022-03-31 DIAGNOSIS — K602 Anal fissure, unspecified: Secondary | ICD-10-CM

## 2022-03-31 DIAGNOSIS — K209 Esophagitis, unspecified without bleeding: Secondary | ICD-10-CM

## 2022-03-31 DIAGNOSIS — K449 Diaphragmatic hernia without obstruction or gangrene: Secondary | ICD-10-CM

## 2022-03-31 MED ORDER — HYDROCORTISONE (PERIANAL) 2.5 % EX CREA: 1.0000 | TOPICAL_CREAM | Freq: Two times a day (BID) | CUTANEOUS | 0 refills | Status: DC

## 2022-03-31 MED ORDER — ESOMEPRAZOLE MAGNESIUM 40 MG PO CPDR: 40.0000 mg | DELAYED_RELEASE_CAPSULE | Freq: Every day | ORAL | 1 refills | Status: DC

## 2022-03-31 MED ORDER — HYDROCORTISONE ACETATE 25 MG RE SUPP: 25.0000 mg | Freq: Two times a day (BID) | RECTAL | 0 refills | Status: DC

## 2022-03-31 NOTE — Telephone Encounter (Signed)
Inbound call from patient regarding "Anusol HC cream" states that pharmacy required a prescription for it. Please advise

## 2022-03-31 NOTE — Progress Notes (Signed)
Jerry Carey    [id number]    09-04-1970  Primary Care Physician:Pharr, Thayer Jew, MD  Referring Physician: Deland Pretty, [address] [place],  [place] [address]   Chief complaint:  Hemorrhoids  HPI:  51 year old very pleasant gentleman here with c/o rectal discomfort and symtomatic hemorrhoids  He has been having on and off intermittent bright red blood per rectum mostly after a bowel movement when he wipes.  Denies any pain or discomfort during defecation was treated for anal fissure many years ago . Experiencing worsening rectal discomfort and is also having mucus discharge, has hemorrhoids protruding.  EGD 03/30/21 - LA Grade C reflux esophagitis with no bleeding. Biopsied. - Esophageal ulcers with no bleeding and no stigmata of recent bleeding. - Small hiatal hernia. - Normal stomach. - Normal examined duodenum.  - ESOPHAGEAL SQUAMOUS AND CARDIAC MUCOSA WITH REFLUX-ASSOCIATED CHANGES - NEGATIVE FOR INTESTINAL METAPLASIA OR DYSPLASIA  Colonoscopy 03/30/21 - Non-bleeding external and internal hemorrhoids. - Anal fissure. - Diverticulosis in the sigmoid colon. - The examination was otherwise normal.  Outpatient Encounter Medications as of 03/31/2022  Medication Sig   AMBULATORY NON FORMULARY MEDICATION Medication Name: .nitr   esomeprazole (NEXIUM) 40 MG capsule Take 1 capsule (40 mg total) by mouth daily before breakfast. Take at least 30 minutes before eating   famotidine (PEPCID) 40 MG tablet 1 tablet at bedtime.   sildenafil (VIAGRA) 100 MG tablet Take 100 mg by mouth as needed. 1/2-1 tablet prn   No facility-administered encounter medications on file as of 03/31/2022.    Allergies as of 03/31/2022 - Review Complete 03/31/2022  Allergen Reaction Noted   Chlorhexidine gluconate Rash 06/23/2015    Past Medical History:  Diagnosis Date   Abscess of left shoulder 06/24/2015   Coagulase-negative staphylococcal infection 07/12/2015    left shoulder wound   Complication of anesthesia    woke up angry and aggitated   GERD (gastroesophageal reflux disease)    Infection    left shoulder wound   Pectoralis muscle rupture    left   Pneumonia    Wears glasses     Past Surgical History:  Procedure Laterality Date   APPLICATION OF WOUND VAC  07/15/2015   I & D EXTREMITY Left 07/21/2015   Procedure: IRRIGATION AND DEBRIDEMENT EXTREMITY; LEFT SHOULDER;  Surgeon: Elsie Saas, MD;  Location: [place];  Service: Orthopedics;  Laterality: Left;   INCISION AND DRAINAGE Left 06/24/2015   Procedure: INCISION AND DRAINAGE LEFT PECTORALIS MUSCLE;  Surgeon: Marchia Bond, MD;  Location: [place];  Service: Orthopedics;  Laterality: Left;   INCISION AND DRAINAGE ABSCESS Left 07/15/2015   Procedure: OPEN INCISION AND DRAINAGE LEFT SHOULDER ;  Surgeon: Elsie Saas, MD;  Location: [place];  Service: Orthopedics;  Laterality: Left;   IRRIGATION AND DEBRIDEMENT ABSCESS Left 07/15/2015   LEFT SHOULDER   IRRIGATION AND DEBRIDEMENT SHOULDER Left 07/18/2015   Procedure: IRRIGATION AND DEBRIDEMENT , VERAFLO WOUND VAC PLACEMENT;  Surgeon: Altamese , MD;  Location: [place];  Service: Orthopedics;  Laterality: Left;   PECTORALIS TENDON REPAIR Left 06/03/2015   Procedure: LEFT PECTORALIS TENDON REPAIR;  Surgeon: Elsie Saas, MD;  Location: [facility];  Service: Orthopedics;  Laterality: Left;   TONSILLECTOMY     WISDOM TOOTH EXTRACTION      Family History  Problem Relation Age of Onset   Hypothyroidism Mother    Meniere's disease Mother    Parkinson's disease  Father    Colon cancer Paternal Uncle    Pancreatic cancer Neg Hx    Liver cancer Neg Hx    Rectal cancer Neg Hx     Social History   Socioeconomic History   Marital status: Married    Spouse name: Not on file   Number of children: Not on file   Years of education: Not on file   Highest education level: Not on file  Occupational History   Not  on file  Tobacco Use   Smoking status: Former    Types: Cigarettes    Quit date: 11/28/2008    Years since quitting: 13.3   Smokeless tobacco: Never  Vaping Use   Vaping Use: Former   Start date: 11/24/2017   Quit date: 05/03/2020  Substance and Sexual Activity   Alcohol use: Yes    Comment: social   Drug use: Yes    Types: Marijuana    Comment: last time 03/28/21   Sexual activity: Not on file  Other Topics Concern   Not on file  Social History Narrative   Not on file   Social Determinants of Health   Financial Resource Strain: Not on file  Food Insecurity: Not on file  Transportation Needs: Not on file  Physical Activity: Not on file  Stress: Not on file  Social Connections: Not on file  Intimate Partner Violence: Not on file      Review of systems: All other review of systems negative except as mentioned in the HPI.   Physical Exam: Vitals:   03/31/22 1053  BP: 122/82  Pulse: 97   Body mass index is 30.74 kg/m. Gen:      No acute distress HEENT:  sclera anicteric Abd:      soft, non-tender; no palpable masses, no distension Ext:    No edema Neuro: alert and oriented x 3 Psych: normal mood and affect  Data Reviewed:  Reviewed labs, radiology imaging, old records and pertinent past GI work up   Assessment and Plan/Recommendations:  51 year old very pleasant gentleman with complaints of worsening thematic hemorrhoids and intermittent bright red blood per rectum He has protruding grade 3 hemorrhoids, thrombosed Advised patient to do sitz bath  Use hydrocortisone HC and RectiCare 2-3 times daily as needed Benefiber 1 tablespoon 2-3 times daily Daily stool softener at bedtime  We will consider hemorrhoidal band ligation if possible, but may need referral to surgery if has persistent prolapsed or thrombosed hemorrhoids  GERD: Use Nexium 40 mg daily and antireflux measures Pepcid at bedtime as needed  Return in 4 to 6 weeks  This visit required 40  minutes of patient care (this includes precharting, chart review, review of results, face-to-face time used for counseling as well as treatment plan and follow-up. The patient was provided an opportunity to ask questions and all were answered. The patient agreed with the plan and demonstrated an understanding of the instructions.  Iona Beard , MD    CC: Merri Brunette, MD

## 2022-03-31 NOTE — Telephone Encounter (Signed)
Prescription for Anusol  [facility] 2.5mg  cream sent to [place]. Patient has been informed and voiced understanding.

## 2022-03-31 NOTE — Telephone Encounter (Signed)
Is he to have the anusol hc 2.5 %? Would you take this one for me please?

## 2022-03-31 NOTE — Patient Instructions (Addendum)
Use : Recti Care and Anusol HC cream three times daily for 3 days, then use twice daily for 1 week. ( Both Recti Care and Anusol HC cream can be purchased over the counter.)   Sitz Bath: Three times daily for 3 days, the twice daily for 1 week.   After you have completed above regimen, then use Anusol Suppositories for 1 week.   Continue Benefiber 1 tablespoon 2-3 times daily.   Continue Colace daily at bedtime.   You have been scheduled for Hemorrhoid Banding on 05/02/22 at 3:30pm with Dr. Lavon Paganini.   We have sent the following medications to your pharmacy for you to pick up at your convenience: Anusol Suppositories , esomeprazole     How to Take a Sitz Bath A sitz bath is a warm water bath that may be used to care for your rectum, genital area, or the area between your rectum and genitals (perineum). In a sitz bath, the water only comes up to your hips and covers your buttocks. A sitz bath may be done in a bathtub or with a portable sitz bath that fits over the toilet. Your health care provider may recommend a sitz bath to help: Relieve pain and discomfort after delivering a baby. Relieve pain and itching from hemorrhoids or anal fissures. Relieve pain after certain surgeries. Relax muscles that are sore or tight. How to take a sitz bath Take 2-4 sitz baths a day, or as many as told by your health care provider. Bathtub sitz bath To take a sitz bath in a bathtub: Partially fill a bathtub with warm water. The water should be deep enough to cover your hips and buttocks when you are sitting in the bathtub. Follow your health care provider's instructions if you are told to put medicine in the water. Sit in the water. Open the bathtub drain a little, and leave it open during your bath. Turn on the warm water again, enough to replace the water that is draining out. Keep the water running throughout your bath. This helps keep the water at the right level and temperature. Soak in the water  for 15-20 minutes, or as long as told by your health care provider. When you are done, be careful when you stand up. You may feel dizzy. After the sitz bath, pat yourself dry. Do not rub your skin to dry it.  Over-the-toilet sitz bath To take a sitz bath with an over-the-toilet basin: Follow the manufacturer's instructions. Fill the basin with warm water. Follow your health care provider's instructions if you were told to put medicine in the water. Sit on the seat. Make sure the water covers your buttocks and perineum. Soak in the water for 15-20 minutes, or as long as told by your health care provider. After the sitz bath, pat yourself dry. Do not rub your skin to dry it. Clean and dry the basin between uses. Discard the basin if it cracks, or according to the manufacturer's instructions.  Contact a health care provider if: Your pain or itching gets worse. Stop doing sitz baths if your symptoms get worse. You have new symptoms. Stop doing sitz baths until you talk with your health care provider. Summary A sitz bath is a warm water bath in which the water only comes up to your hips and covers your buttocks. Your health care provider may recommend a sitz bath to help relieve pain and discomfort after delivering a baby, relieve pain and itching from hemorrhoids or anal fissures, relieve  pain after certain surgeries, or help to relax muscles that are sore or tight. Take 2-4 sitz baths a day, or as many as told by your health care provider. Soak in the water for 15-20 minutes. Stop doing sitz baths if your symptoms get worse. This information is not intended to replace advice given to you by your health care provider. Make sure you discuss any questions you have with your health care provider. Document Revised: 09/13/2021 Document Reviewed: 09/13/2021 Elsevier Patient Education  [place] you for choosing me and [facility].  Dr. Silverio Decamp

## 2022-04-03 NOTE — Progress Notes (Unsigned)
   I, Peterson Lombard, LAT, ATC acting as a scribe for Lynne Leader, MD.  Jerry Carey is a 51 y.o. male who presents to [place] [facility] today for f/u L foot pain concerning for 2nd MT stress fx/reaction. Pt was last seen by Dr. Georgina Snell on 03/07/2022 and was advised to continue postop shoe for another 2 weeks and then wean out, also advised he could use turf toe insoles.  Today, patient reports  Dx imaging: 03/07/22 L foot XR 02/06/22 L foot XR  Pertinent review of systems: ***  Relevant historical information: ***   Exam:  There were no vitals taken for this visit. General: Well Developed, well nourished, and in no acute distress.   MSK: ***    Lab and Radiology Results No results found for this or any previous visit (from the past 72 hour(s)). No results found.     Assessment and Plan: 51 y.o. male with ***   PDMP not reviewed this encounter. No orders of the defined types were placed in this encounter.  No orders of the defined types were placed in this encounter.    Discussed warning signs or symptoms. Please see discharge instructions. Patient expresses understanding.   ***

## 2022-04-04 ENCOUNTER — Ambulatory Visit (INDEPENDENT_AMBULATORY_CARE_PROVIDER_SITE_OTHER): Payer: 59 | Admitting: Family Medicine

## 2022-04-04 VITALS — BP 118/76 | HR 64 | Ht 73.0 in | Wt 231.0 lb

## 2022-04-04 DIAGNOSIS — M79672 Pain in left foot: Secondary | ICD-10-CM

## 2022-04-04 NOTE — Patient Instructions (Signed)
Thank you for coming in today.   OK to wean out of the shoe.   If this does not go well let me know and we can do a MRI.

## 2022-04-14 MED ORDER — HYDROCORTISONE (PERIANAL) 2.5 % EX CREA: 1.0000 | TOPICAL_CREAM | Freq: Two times a day (BID) | CUTANEOUS | 0 refills | Status: DC

## 2022-04-14 MED ORDER — HYDROCORTISONE ACETATE 25 MG RE SUPP: 25.0000 mg | Freq: Two times a day (BID) | RECTAL | 0 refills | Status: DC

## 2022-04-14 NOTE — Addendum Note (Signed)
Addended by: Debbe Mounts on: 04/14/2022 02:06 PM   Modules accepted: Orders

## 2022-04-14 NOTE — Telephone Encounter (Signed)
Refill has been sent to [facility]. Left  message for patient to inform him that refill has been sent.

## 2022-04-14 NOTE — Progress Notes (Signed)
Patient wife called back, states that patient needed refill on Anusol suppositories and not anusol cream. Refill for suppositories sent to pharmacy.

## 2022-04-14 NOTE — Telephone Encounter (Signed)
Inbound call from patient requesting a refill for Anusol medication. Please advise.  Thank you

## 2022-04-19 ENCOUNTER — Telehealth: Payer: Self-pay | Admitting: Gastroenterology

## 2022-04-19 NOTE — Telephone Encounter (Signed)
I just spoke to [facility] and he is requesting a refill on his anusol hc suppositories to cover him until his November 7th banding appointment. He is currently taking XS tylenol for his on/off pain. He takes 2 pills at a time. He states his pain is an 8 out of 10 and he wants to know if you can send in something stronger please.

## 2022-04-19 NOTE — Telephone Encounter (Signed)
Patient returned your call regarding the banding appt. Jerry Carey he prefers to keep 05/02/22.

## 2022-04-19 NOTE — Telephone Encounter (Signed)
Patient called seeking refill on suppositories and also asked if he can get medication for pain. Please advise.

## 2022-04-20 ENCOUNTER — Other Ambulatory Visit: Payer: Self-pay

## 2022-04-20 ENCOUNTER — Encounter: Payer: Self-pay | Admitting: Gastroenterology

## 2022-04-20 MED ORDER — HYDROCORTISONE ACETATE 25 MG RE SUPP: 25.0000 mg | Freq: Two times a day (BID) | RECTAL | 0 refills | Status: DC

## 2022-04-20 MED ORDER — TRAMADOL HCL 50 MG PO TABS: 50.0000 mg | ORAL_TABLET | Freq: Three times a day (TID) | ORAL | 0 refills | Status: DC | PRN

## 2022-04-20 NOTE — Telephone Encounter (Signed)
Please advise patient to use recticare (Lidocaine gel) along with hydrocortisone suppositories for hemorrhoids. Sitz bath can help relieve the swelling and discomfort. If he has enlarged protruding thrombosed hemorrhoids will need urgent referral to surgery

## 2022-04-20 NOTE — Telephone Encounter (Signed)
Okay to send in refill for Anusol HC suppositories at bedtime as needed X 12 with 1 refill. November 7 appointment will need to be rescheduled as there was an error in the scheduling.  Our office was trying to contact him with new appointment.  Thank you

## 2022-04-20 NOTE — Telephone Encounter (Signed)
Called the patient to discuss. Tramadol to the [facility] as per Dr Silverio Decamp. Patient does not answer the phone. Left a message with this information. Called [facility] to schedule an appointment for the patient to be evaluated. Patient is not in their "system" and they ask for a faxed referral. Papers printed and faxed as requested asking for an appointment asap due to the level of discomfort.

## 2022-04-20 NOTE — Telephone Encounter (Signed)
Duplicated message. Closing this encounter

## 2022-04-20 NOTE — Telephone Encounter (Signed)
I have spoken to the patient. He expresses his frustration but understands. He is moved to 05/11/22. He wants something other than ES Tylenol for his hemorrhoid pain and a refill on the suppositories. Can he have something stronger?

## 2022-04-20 NOTE — Telephone Encounter (Signed)
Any pain rx?

## 2022-05-02 ENCOUNTER — Encounter: Payer: 59 | Admitting: Gastroenterology

## 2022-05-03 NOTE — Telephone Encounter (Signed)
Is this okay?

## 2022-05-03 NOTE — Telephone Encounter (Signed)
Inbound call from patient requesting a refill on tramadol and the suppositories to hold him over until his appt on 11/16. Please advise.

## 2022-05-04 ENCOUNTER — Other Ambulatory Visit: Payer: Self-pay

## 2022-05-04 MED ORDER — TRAMADOL HCL 50 MG PO TABS: 50.0000 mg | ORAL_TABLET | Freq: Three times a day (TID) | ORAL | 1 refills | Status: DC | PRN

## 2022-05-04 NOTE — Telephone Encounter (Signed)
Yes its fine to refill. Thanks

## 2022-05-04 NOTE — Telephone Encounter (Signed)
Refill transmitted.

## 2022-05-06 ENCOUNTER — Telehealth: Payer: Self-pay | Admitting: Physician Assistant

## 2022-05-06 MED ORDER — TRAMADOL HCL 50 MG PO TABS: 50.0000 mg | ORAL_TABLET | Freq: Three times a day (TID) | ORAL | 1 refills | Status: DC | PRN

## 2022-05-07 NOTE — Telephone Encounter (Signed)
Patient called requesting tramadol and hydrocortisone on 11/08 to last until his appointment on 11/16 with Dr. Lavon Paganini for possible hemorrhoidal banding. Dr. Lavon Paganini approved this 11/09 and this was suppose to be transmitted 11/09 however it appears it was phoned in and was not sent electronically. Patient called with pain requesting medication sent in. I reviewed PDMP personally and he had not picked up a new prescription. Sent prescription tramadol 50mg  #20 1 RF electronically with impritiva to pharmacy [facility] on [address], patient confirmed pick up.

## 2022-05-08 NOTE — Telephone Encounter (Signed)
Thank you :)

## 2022-05-11 ENCOUNTER — Ambulatory Visit (INDEPENDENT_AMBULATORY_CARE_PROVIDER_SITE_OTHER): Payer: 59 | Admitting: Gastroenterology

## 2022-05-11 ENCOUNTER — Encounter: Payer: Self-pay | Admitting: Gastroenterology

## 2022-05-11 VITALS — BP 112/82 | HR 107 | Ht 73.0 in | Wt 228.0 lb

## 2022-05-11 DIAGNOSIS — K625 Hemorrhage of anus and rectum: Secondary | ICD-10-CM | POA: Diagnosis not present

## 2022-05-11 DIAGNOSIS — K648 Other hemorrhoids: Secondary | ICD-10-CM

## 2022-05-11 DIAGNOSIS — K6289 Other specified diseases of anus and rectum: Secondary | ICD-10-CM

## 2022-05-11 DIAGNOSIS — K642 Third degree hemorrhoids: Secondary | ICD-10-CM | POA: Diagnosis not present

## 2022-05-11 MED ORDER — DILTIAZEM GEL 2 %: 1.0000 | Freq: Three times a day (TID) | CUTANEOUS | 1 refills | Status: DC

## 2022-05-11 MED ORDER — HYDROCORTISONE ACETATE 25 MG RE SUPP: 25.0000 mg | Freq: Two times a day (BID) | RECTAL | 1 refills | Status: DC

## 2022-05-11 MED ORDER — HYDROCORTISONE (PERIANAL) 2.5 % EX CREA: 1.0000 | TOPICAL_CREAM | Freq: Two times a day (BID) | CUTANEOUS | 1 refills | Status: DC

## 2022-05-11 NOTE — Patient Instructions (Signed)
We have sent a prescription for diltiazem gel 2 % gel to [facility]. You should apply a pea size amount to your rectum three times daily.  [facility]'s information is below: Address: [address], [place], [place] [address]  [phone]  *Please DO NOT go directly from our office to pick up this medication! Give the pharmacy 1 day to process the prescription as this is compounded and takes time to make.  We have sent the following medications to your pharmacy for you to pick up at your convenience: anusol cream and suppositories.  Start over the counter Rect-care and Benefiber daily as needed.   The [facility] providers would like to encourage you to use [facility] to communicate with providers for non-urgent requests or questions.  Due to long hold times on the telephone, sending your provider a message by [facility] may be a faster and more efficient way to get a response.  Please allow 48 business hours for a response.  Please remember that this is for non-urgent requests.

## 2022-05-11 NOTE — Progress Notes (Signed)
Jerry Carey    [id number]    February 10, 1971  Primary Care Physician:Pharr, Zollie Beckers, MD  Referring Physician: Merri Brunette, MD [address] [place],  [place] [address]   Chief complaint: Anorectal discomfort, hemorrhoids  HPI:  51 year old very pleasant gentleman here with c/o rectal discomfort and symtomatic hemorrhoids   He continues to have anal/ rectal discomfort.  He is doing sitz bath and using azole cream, feels hemorrhoids are slightly decreased in size but he is unable to sit for prolonged time period.  He is continue to have small-volume bright red blood per rectum   EGD 03/30/21 - LA Grade C reflux esophagitis with no bleeding. Biopsied. - Esophageal ulcers with no bleeding and no stigmata of recent bleeding. - Small hiatal hernia. - Normal stomach. - Normal examined duodenum.   - ESOPHAGEAL SQUAMOUS AND CARDIAC MUCOSA WITH REFLUX-ASSOCIATED CHANGES - NEGATIVE FOR INTESTINAL METAPLASIA OR DYSPLASIA   Colonoscopy 03/30/21 - Non-bleeding external and internal hemorrhoids. - Anal fissure. - Diverticulosis in the sigmoid colon. - The examination was otherwise normal.   Outpatient Encounter Medications as of 05/11/2022  Medication Sig   AMBULATORY NON FORMULARY MEDICATION Medication Name: .nitr   esomeprazole (NEXIUM) 40 MG capsule Take 1 capsule (40 mg total) by mouth daily before breakfast. Take at least 30 minutes before eating   famotidine (PEPCID) 40 MG tablet 1 tablet at bedtime.   hydrocortisone (ANUSOL-HC) 2.5 % rectal cream Place 1 Application rectally 2 (two) times daily.   hydrocortisone (ANUSOL-HC) 25 MG suppository Place 1 suppository (25 mg total) rectally every 12 (twelve) hours.   sildenafil (VIAGRA) 100 MG tablet Take 100 mg by mouth as needed. 1/2-1 tablet prn   traMADol (ULTRAM) 50 MG tablet Take 1 tablet (50 mg total) by mouth every 8 (eight) hours as needed.   No facility-administered encounter medications on file as  of 05/11/2022.    Allergies as of 05/11/2022 - Review Complete 05/11/2022  Allergen Reaction Noted   Chlorhexidine gluconate Rash 06/23/2015    Past Medical History:  Diagnosis Date   Abscess of left shoulder 06/24/2015   Coagulase-negative staphylococcal infection 07/12/2015   left shoulder wound   Complication of anesthesia    woke up angry and aggitated   GERD (gastroesophageal reflux disease)    Infection    left shoulder wound   Pectoralis muscle rupture    left   Pneumonia    Wears glasses     Past Surgical History:  Procedure Laterality Date   APPLICATION OF WOUND VAC  07/15/2015   I & D EXTREMITY Left 07/21/2015   Procedure: IRRIGATION AND DEBRIDEMENT EXTREMITY; LEFT SHOULDER;  Surgeon: Salvatore Marvel, MD;  Location: [facility];  Service: Orthopedics;  Laterality: Left;   INCISION AND DRAINAGE Left 06/24/2015   Procedure: INCISION AND DRAINAGE LEFT PECTORALIS MUSCLE;  Surgeon: Teryl Lucy, MD;  Location: [facility];  Service: Orthopedics;  Laterality: Left;   INCISION AND DRAINAGE ABSCESS Left 07/15/2015   Procedure: OPEN INCISION AND DRAINAGE LEFT SHOULDER ;  Surgeon: Salvatore Marvel, MD;  Location: [facility] OR;  Service: Orthopedics;  Laterality: Left;   IRRIGATION AND DEBRIDEMENT ABSCESS Left 07/15/2015   LEFT SHOULDER   IRRIGATION AND DEBRIDEMENT SHOULDER Left 07/18/2015   Procedure: IRRIGATION AND DEBRIDEMENT , VERAFLO WOUND VAC PLACEMENT;  Surgeon: Myrene Galas, MD;  Location: [facility];  Service: Orthopedics;  Laterality: Left;   PECTORALIS TENDON REPAIR Left 06/03/2015   Procedure: LEFT PECTORALIS TENDON  REPAIR;  Surgeon: Salvatore Marvel, MD;  Location: [facility];  Service: Orthopedics;  Laterality: Left;   TONSILLECTOMY     WISDOM TOOTH EXTRACTION      Family History  Problem Relation Age of Onset   Hypothyroidism Mother    Meniere's disease Mother    Parkinson's disease Father    Colon cancer Paternal Uncle    Pancreatic cancer Neg Hx     Liver cancer Neg Hx    Rectal cancer Neg Hx     Social History   Socioeconomic History   Marital status: Married    Spouse name: Not on file   Number of children: Not on file   Years of education: Not on file   Highest education level: Not on file  Occupational History   Not on file  Tobacco Use   Smoking status: Former    Types: Cigarettes    Quit date: 11/28/2008    Years since quitting: 13.4   Smokeless tobacco: Never  Vaping Use   Vaping Use: Former   Start date: 11/24/2017   Quit date: 05/03/2020  Substance and Sexual Activity   Alcohol use: Yes    Comment: social   Drug use: Yes    Types: Marijuana    Comment: last time 03/28/21   Sexual activity: Not on file  Other Topics Concern   Not on file  Social History Narrative   Not on file   Social Determinants of Health   Financial Resource Strain: Not on file  Food Insecurity: Not on file  Transportation Needs: Not on file  Physical Activity: Not on file  Stress: Not on file  Social Connections: Not on file  Intimate Partner Violence: Not on file      Review of systems: All other review of systems negative except as mentioned in the HPI.   Physical Exam: Vitals:   05/11/22 1127  BP: 112/82  Pulse: (Abnormal) 107   Body mass index is 30.08 kg/m. Gen:      No acute distress HEENT:  sclera anicteric Abd:      soft, non-tender; no palpable masses, no distension Ext:    No edema Neuro: alert and oriented x 3 Psych: normal mood and affect  Data Reviewed:  Reviewed labs, radiology imaging, old records and pertinent past GI work up   Assessment and Plan/Recommendations:  51 year old very pleasant gentleman with complaints of worsening thematic hemorrhoids and intermittent bright red blood per rectum He has protruding grade 3 hemorrhoids, thrombosed Unable to do anoscopy, he has significant tenderness on rectal exam Cannot exclude anal fissure in addition to protruding hemorrhoids Use diltiazem 2% gel  3 times daily Use hydrocortisone HC and RectiCare 2-3 times daily as needed Benefiber 1 tablespoon 2-3 times daily Daily stool softener at bedtime Continue sitz bath   He will benefit from exam under anesthesia and surgical evaluation for possible hemorrhoidectomy, has appointment with colorectal surgery next week   GERD: Use Nexium 40 mg daily and antireflux measures Pepcid at bedtime as needed    The patient was provided an opportunity to ask questions and all were answered. The patient agreed with the plan and demonstrated an understanding of the instructions.  Iona Beard , MD    CC: Merri Brunette, MD

## 2022-05-22 ENCOUNTER — Encounter: Payer: Self-pay | Admitting: Gastroenterology

## 2022-05-29 ENCOUNTER — Ambulatory Visit: Payer: Self-pay | Admitting: General Surgery

## 2022-05-29 NOTE — H&P (View-Only) (Signed)
REFERRING PHYSICIAN:  Montel Culver, MD   PROVIDER:  Elenora Gamma, MD   MRN: [id number] DOB: 1970/11/15 DATE OF ENCOUNTER: 05/29/2022   Subjective    Chief Complaint: New Consultation ( thrombosed hems)       History of Present Illness: Jerry Carey is a 51 y.o. male who is seen today as an office consultation at the request of Dr. Vida Roller for evaluation of New Consultation ( thrombosed hems) .   51 year old male who presents to the office for evaluation of hemorrhoids.  He states that he has had external hemorrhoids for many years due to weight lifting and bodybuilding as a young adult.  They have not bothered him until approximately 11 to 12 months ago when he did started developing anal pain and occasional bleeding.  This seems to wax and wane.  He has tried stool softeners and dietary changes with no real success.  His last colonoscopy was in October 2022 and showed a small anal fissure diverticuli and nonbleeding internal and external hemorrhoids.     Review of Systems: A complete review of systems was obtained from the patient.  I have reviewed this information and discussed as appropriate with the patient.  See HPI as well for other ROS.     Medical History: Past Medical History      Past Medical History:  Diagnosis Date   GERD (gastroesophageal reflux disease)     Sleep apnea          There is no problem list on file for this patient.     Past Surgical History       Past Surgical History:  Procedure Laterality Date   Pectoralis tendon repair            Allergies       Allergies  Allergen Reactions   Chlorhexidine Gluconate Rash      Patient states he had severe burning and rash and was given benadryl              Current Outpatient Medications on File Prior to Visit  Medication Sig Dispense Refill   esomeprazole (NEXIUM) 40 MG DR capsule Take by mouth       hydrocortisone (ANUSOL-HC) 25 mg suppository Place rectally        traMADoL (ULTRAM) 50 mg tablet Take 50 mg by mouth every 8 (eight) hours as needed        No current facility-administered medications on file prior to visit.      Family History       Family History  Problem Relation Age of Onset   High blood pressure (Hypertension) Father          Social History        Tobacco Use  Smoking Status Former   Types: Cigarettes  Smokeless Tobacco Never      Social History  Social History         Socioeconomic History   Marital status: Married  Tobacco Use   Smoking status: Former      Types: Cigarettes   Smokeless tobacco: Never  Substance and Sexual Activity   Alcohol use: Not Currently   Drug use: Never        Objective:         Vitals:    05/29/22 1536  BP: 115/70  Pulse: 102  Temp: 36.7 C (98 F)  SpO2: 92%  Weight: (!) 107 kg (236 lb)  Height: 185.4 cm (6\' 1" )  Exam Gen: NAD Abd: soft TTP circumferentially, no fissure visualized, sphincter htn present     Labs, Imaging and Diagnostic Testing: Anoscopy attempted but not completed due to pain   Assessment and Plan:  Diagnoses and all orders for this visit:   Anal pain       51 year old male with longstanding external hemorrhoid disease who presents to the office with bleeding and anal pain that has worsened over the last year.  He reports regular but frequent bowel habits.  I am unable to do a complete exam today due to pain.  I am unsure if this pain is being caused by an anal fissure or inflamed internal hemorrhoids.  I have recommended an exam under anesthesia with possible hemorrhoidal ligation versus chemical sphincterotomy.  We briefly discussed hemorrhoidectomy as well.  We discussed the typical postoperative recovery time and postoperative pain.  He feels that he is not in a good place in his life right now to undergo this and is requesting a procedure with less intensive recovery if possible.  We will know more information after the exam under  anesthesia.   No follow-ups on file. Rosario Adie, MD Colon and Rectal Surgery [facility]

## 2022-05-29 NOTE — H&P (Signed)
REFERRING PHYSICIAN:  Montel Culver, MD   PROVIDER:  Elenora Gamma, MD   MRN: [id number] DOB: 1970/11/15 DATE OF ENCOUNTER: 05/29/2022   Subjective    Chief Complaint: New Consultation ( thrombosed hems)       History of Present Illness: Jerry Carey is a 51 y.o. male who is seen today as an office consultation at the request of Dr. Vida Roller for evaluation of New Consultation ( thrombosed hems) .   51 year old male who presents to the office for evaluation of hemorrhoids.  He states that he has had external hemorrhoids for many years due to weight lifting and bodybuilding as a young adult.  They have not bothered him until approximately 11 to 12 months ago when he did started developing anal pain and occasional bleeding.  This seems to wax and wane.  He has tried stool softeners and dietary changes with no real success.  His last colonoscopy was in October 2022 and showed a small anal fissure diverticuli and nonbleeding internal and external hemorrhoids.     Review of Systems: A complete review of systems was obtained from the patient.  I have reviewed this information and discussed as appropriate with the patient.  See HPI as well for other ROS.     Medical History: Past Medical History      Past Medical History:  Diagnosis Date   GERD (gastroesophageal reflux disease)     Sleep apnea          There is no problem list on file for this patient.     Past Surgical History       Past Surgical History:  Procedure Laterality Date   Pectoralis tendon repair            Allergies       Allergies  Allergen Reactions   Chlorhexidine Gluconate Rash      Patient states he had severe burning and rash and was given benadryl              Current Outpatient Medications on File Prior to Visit  Medication Sig Dispense Refill   esomeprazole (NEXIUM) 40 MG DR capsule Take by mouth       hydrocortisone (ANUSOL-HC) 25 mg suppository Place rectally        traMADoL (ULTRAM) 50 mg tablet Take 50 mg by mouth every 8 (eight) hours as needed        No current facility-administered medications on file prior to visit.      Family History       Family History  Problem Relation Age of Onset   High blood pressure (Hypertension) Father          Social History        Tobacco Use  Smoking Status Former   Types: Cigarettes  Smokeless Tobacco Never      Social History  Social History         Socioeconomic History   Marital status: Married  Tobacco Use   Smoking status: Former      Types: Cigarettes   Smokeless tobacco: Never  Substance and Sexual Activity   Alcohol use: Not Currently   Drug use: Never        Objective:         Vitals:    05/29/22 1536  BP: 115/70  Pulse: 102  Temp: 36.7 C (98 F)  SpO2: 92%  Weight: (!) 107 kg (236 lb)  Height: 185.4 cm (6\' 1" )  Exam Gen: NAD Abd: soft TTP circumferentially, no fissure visualized, sphincter htn present     Labs, Imaging and Diagnostic Testing: Anoscopy attempted but not completed due to pain   Assessment and Plan:  Diagnoses and all orders for this visit:   Anal pain       51-year-old male with longstanding external hemorrhoid disease who presents to the office with bleeding and anal pain that has worsened over the last year.  He reports regular but frequent bowel habits.  I am unable to do a complete exam today due to pain.  I am unsure if this pain is being caused by an anal fissure or inflamed internal hemorrhoids.  I have recommended an exam under anesthesia with possible hemorrhoidal ligation versus chemical sphincterotomy.  We briefly discussed hemorrhoidectomy as well.  We discussed the typical postoperative recovery time and postoperative pain.  He feels that he is not in a good place in his life right now to undergo this and is requesting a procedure with less intensive recovery if possible.  We will know more information after the exam under  anesthesia.   No follow-ups on file. Jerry Ding C Adaira Centola, MD Colon and Rectal Surgery [facility]    

## 2022-05-30 ENCOUNTER — Encounter (HOSPITAL_COMMUNITY): Payer: Self-pay | Admitting: General Surgery

## 2022-05-30 NOTE — Progress Notes (Signed)
For Short Stay: COVID SWAB appointment date:  Bowel Prep reminder:   For Anesthesia: PCP - Merri Brunette, MD  Cardiologist - Marinus Maw, MD   Chest x-ray -  EKG - N/A Stress Test -  ECHO - greater than 2 years in [facility] Cardiac Cath -  Pacemaker/ICD device last checked: Pacemaker orders received: Device Rep notified:  Spinal Cord Stimulator:  Sleep Study -  CPAP -   Fasting Blood Sugar -  Checks Blood Sugar _____ times a day Date and result of last Hgb A1c-  Last dose of GLP1 agonist-  GLP1 instructions:   Last dose of SGLT-2 inhibitors-  SGLT-2 instructions:  Blood Thinner Instructions: Aspirin Instructions: Last Dose:  Activity level: Can go up a flight of stairs and activities of daily living without stopping and without chest pain and/or shortness of breath   Able to exercise without chest pain and/or shortness of breath   Unable to go up a flight of stairs without chest pain and/or shortness of breath     Anesthesia review:   Patient denies shortness of breath, fever, cough and chest pain at PAT appointment   Patient verbalized understanding of instructions reviewed via telephone.

## 2022-05-31 ENCOUNTER — Other Ambulatory Visit: Payer: Self-pay

## 2022-05-31 ENCOUNTER — Encounter (HOSPITAL_COMMUNITY): Payer: Self-pay | Admitting: General Surgery

## 2022-06-01 ENCOUNTER — Encounter (HOSPITAL_COMMUNITY): Payer: Self-pay | Admitting: General Surgery

## 2022-06-01 ENCOUNTER — Encounter (HOSPITAL_COMMUNITY)
Admission: RE | Disposition: A | Discharge status: Home / Self Care | Payer: Self-pay | Source: Home / Self Care | Attending: General Surgery

## 2022-06-01 ENCOUNTER — Ambulatory Visit (HOSPITAL_COMMUNITY)
Admission: RE | Admit: 2022-06-01 | Discharge: 2022-06-01 | Disposition: A | Discharge status: Home / Self Care | Payer: 59 | Attending: General Surgery | Admitting: General Surgery

## 2022-06-01 ENCOUNTER — Ambulatory Visit (HOSPITAL_COMMUNITY): Payer: 59 | Admitting: Anesthesiology

## 2022-06-01 ENCOUNTER — Ambulatory Visit (HOSPITAL_BASED_OUTPATIENT_CLINIC_OR_DEPARTMENT_OTHER): Payer: 59 | Admitting: Anesthesiology

## 2022-06-01 ENCOUNTER — Other Ambulatory Visit: Payer: Self-pay

## 2022-06-01 DIAGNOSIS — K6289 Other specified diseases of anus and rectum: Secondary | ICD-10-CM

## 2022-06-01 DIAGNOSIS — Z87891 Personal history of nicotine dependence: Secondary | ICD-10-CM | POA: Diagnosis not present

## 2022-06-01 DIAGNOSIS — K644 Residual hemorrhoidal skin tags: Secondary | ICD-10-CM | POA: Insufficient documentation

## 2022-06-01 DIAGNOSIS — K641 Second degree hemorrhoids: Secondary | ICD-10-CM | POA: Insufficient documentation

## 2022-06-01 DIAGNOSIS — G4730 Sleep apnea, unspecified: Secondary | ICD-10-CM | POA: Insufficient documentation

## 2022-06-01 DIAGNOSIS — K219 Gastro-esophageal reflux disease without esophagitis: Secondary | ICD-10-CM | POA: Insufficient documentation

## 2022-06-01 DIAGNOSIS — K449 Diaphragmatic hernia without obstruction or gangrene: Secondary | ICD-10-CM

## 2022-06-01 DIAGNOSIS — I4729 Other ventricular tachycardia: Secondary | ICD-10-CM

## 2022-06-01 HISTORY — DX: Syncope and collapse: R55

## 2022-06-01 HISTORY — PX: RECTAL EXAM UNDER ANESTHESIA: SHX6399

## 2022-06-01 HISTORY — DX: Personal history of other diseases of the digestive system: Z87.19

## 2022-06-01 HISTORY — DX: Personal history of peptic ulcer disease: Z87.11

## 2022-06-01 HISTORY — DX: Sleep apnea, unspecified: G47.30

## 2022-06-01 SURGERY — EXAM UNDER ANESTHESIA, RECTUM
Anesthesia: Monitor Anesthesia Care | Site: Anus

## 2022-06-01 MED ORDER — ONDANSETRON HCL 4 MG/2ML IJ SOLN
INTRAMUSCULAR | Status: DC | PRN
  Administered 2022-06-01: 4 mg via INTRAVENOUS

## 2022-06-01 MED ORDER — PROPOFOL 10 MG/ML IV BOLUS
INTRAVENOUS | Status: DC | PRN
  Administered 2022-06-01: 20 mg via INTRAVENOUS

## 2022-06-01 MED ORDER — BUPIVACAINE LIPOSOME 1.3 % IJ SUSP: 20.0000 mL | Freq: Once | INTRAMUSCULAR | Status: DC

## 2022-06-01 MED ORDER — TRAMADOL HCL 50 MG PO TABS: 50.0000 mg | ORAL_TABLET | Freq: Three times a day (TID) | ORAL | 0 refills | Status: DC | PRN

## 2022-06-01 MED ORDER — LACTATED RINGERS IV SOLN: INTRAVENOUS | Status: DC | PRN

## 2022-06-01 MED ORDER — HYDROGEN PEROXIDE 3 % EX SOLN
CUTANEOUS | Status: AC
  Filled 2022-06-01: qty 473

## 2022-06-01 MED ORDER — FENTANYL CITRATE PF 50 MCG/ML IJ SOSY: 25.0000 ug | PREFILLED_SYRINGE | INTRAMUSCULAR | Status: DC | PRN

## 2022-06-01 MED ORDER — MIDAZOLAM HCL 2 MG/2ML IJ SOLN
INTRAMUSCULAR | Status: AC
  Filled 2022-06-01: qty 2

## 2022-06-01 MED ORDER — BUPIVACAINE LIPOSOME 1.3 % IJ SUSP
INTRAMUSCULAR | Status: AC
  Filled 2022-06-01: qty 20

## 2022-06-01 MED ORDER — BUPIVACAINE-EPINEPHRINE (PF) 0.5% -1:200000 IJ SOLN
INTRAMUSCULAR | Status: AC
  Filled 2022-06-01: qty 30

## 2022-06-01 MED ORDER — ONABOTULINUMTOXINA 100 UNITS IJ SOLR
100.0000 [IU] | INTRAMUSCULAR | Status: DC
  Filled 2022-06-01: qty 100

## 2022-06-01 MED ORDER — GABAPENTIN 300 MG PO CAPS
300.0000 mg | ORAL_CAPSULE | ORAL | Status: AC
  Administered 2022-06-01: 300 mg via ORAL
  Filled 2022-06-01: qty 1

## 2022-06-01 MED ORDER — PROPOFOL 500 MG/50ML IV EMUL
INTRAVENOUS | Status: DC | PRN
  Administered 2022-06-01: 75 ug/kg/min via INTRAVENOUS

## 2022-06-01 MED ORDER — FENTANYL CITRATE (PF) 100 MCG/2ML IJ SOLN
INTRAMUSCULAR | Status: DC | PRN
  Administered 2022-06-01: 100 ug via INTRAVENOUS

## 2022-06-01 MED ORDER — FENTANYL CITRATE (PF) 100 MCG/2ML IJ SOLN
INTRAMUSCULAR | Status: AC
  Filled 2022-06-01: qty 2

## 2022-06-01 MED ORDER — BUPIVACAINE-EPINEPHRINE (PF) 0.5% -1:200000 IJ SOLN
INTRAMUSCULAR | Status: DC | PRN
  Administered 2022-06-01: 10 mL

## 2022-06-01 MED ORDER — MIDAZOLAM HCL 2 MG/2ML IJ SOLN
INTRAMUSCULAR | Status: DC | PRN
  Administered 2022-06-01: 2 mg via INTRAVENOUS

## 2022-06-01 MED ORDER — SODIUM CHLORIDE 0.9% FLUSH: 3.0000 mL | Freq: Two times a day (BID) | INTRAVENOUS | Status: DC

## 2022-06-01 MED ORDER — HYDROCORTISONE (PERIANAL) 2.5 % EX CREA: 1.0000 | TOPICAL_CREAM | Freq: Two times a day (BID) | CUTANEOUS | 1 refills | Status: DC

## 2022-06-01 MED ORDER — CELECOXIB 200 MG PO CAPS
200.0000 mg | ORAL_CAPSULE | ORAL | Status: AC
  Administered 2022-06-01: 200 mg via ORAL
  Filled 2022-06-01: qty 1

## 2022-06-01 MED ORDER — BUPIVACAINE LIPOSOME 1.3 % IJ SUSP
INTRAMUSCULAR | Status: DC | PRN
  Administered 2022-06-01: 20 mL

## 2022-06-01 MED ORDER — ACETAMINOPHEN 500 MG PO TABS
1000.0000 mg | ORAL_TABLET | ORAL | Status: AC
  Administered 2022-06-01: 1000 mg via ORAL
  Filled 2022-06-01: qty 2

## 2022-06-01 MED ORDER — LACTATED RINGERS IV SOLN: INTRAVENOUS | Status: DC

## 2022-06-01 SURGICAL SUPPLY — 47 items
BAG COUNTER SPONGE SURGICOUNT (BAG) IMPLANT
BENZOIN TINCTURE PRP APPL 2/3 (GAUZE/BANDAGES/DRESSINGS) ×2 IMPLANT
BLADE SURG 15 STRL LF DISP TIS (BLADE) ×2 IMPLANT
BLADE SURG 15 STRL SS (BLADE) ×2
BLADE SURG SZ10 CARB STEEL (BLADE) ×2 IMPLANT
BRIEF MESH DISP LRG (UNDERPADS AND DIAPERS) ×4 IMPLANT
COVER SURGICAL LIGHT HANDLE (MISCELLANEOUS) ×2 IMPLANT
DRAPE UTILITY XL STRL (DRAPES) ×2 IMPLANT
DRSG VASELINE 3X18 (GAUZE/BANDAGES/DRESSINGS) IMPLANT
ELECT REM PT RETURN 15FT ADLT (MISCELLANEOUS) ×2 IMPLANT
GAUZE 4X4 16PLY ~~LOC~~+RFID DBL (SPONGE) ×2 IMPLANT
GAUZE PAD ABD 8X10 STRL (GAUZE/BANDAGES/DRESSINGS) ×1 IMPLANT
GAUZE SPONGE 4X4 12PLY STRL (GAUZE/BANDAGES/DRESSINGS) ×1 IMPLANT
GLOVE BIO SURGEON STRL SZ 6.5 (GLOVE) ×2 IMPLANT
GLOVE BIOGEL PI IND STRL 7.0 (GLOVE) ×2 IMPLANT
GLOVE INDICATOR 6.5 STRL GRN (GLOVE) ×2 IMPLANT
GOWN STRL REUS W/ TWL XL LVL3 (GOWN DISPOSABLE) ×4 IMPLANT
GOWN STRL REUS W/TWL XL LVL3 (GOWN DISPOSABLE) ×4
IV CATH 14GX2 1/4 (CATHETERS) IMPLANT
KIT BASIN OR (CUSTOM PROCEDURE TRAY) ×2 IMPLANT
KIT TURNOVER KIT A (KITS) IMPLANT
LEGGING LITHOTOMY PAIR STRL (DRAPES) ×2 IMPLANT
LOOP VESSEL MAXI BLUE (MISCELLANEOUS) IMPLANT
NDL HYPO 25X1 1.5 SAFETY (NEEDLE) ×1 IMPLANT
NDL SAFETY ECLIP 18X1.5 (MISCELLANEOUS) IMPLANT
NEEDLE HYPO 22GX1.5 SAFETY (NEEDLE) ×2 IMPLANT
NEEDLE HYPO 25X1 1.5 SAFETY (NEEDLE) ×2 IMPLANT
PACK LITHOTOMY IV (CUSTOM PROCEDURE TRAY) ×2 IMPLANT
PENCIL SMOKE EVACUATOR (MISCELLANEOUS) IMPLANT
SOL SCRUB PVP POV-IOD 4OZ 7.5% (MISCELLANEOUS) ×2
SOLUTION SCRB POV-IOD 4OZ 7.5% (MISCELLANEOUS) ×2 IMPLANT
SPIKE FLUID TRANSFER (MISCELLANEOUS) ×2 IMPLANT
SPONGE SURGIFOAM ABS GEL 12-7 (HEMOSTASIS) IMPLANT
SURGILUBE 2OZ TUBE FLIPTOP (MISCELLANEOUS) ×2 IMPLANT
SUT CHROMIC 2 0 SH (SUTURE) IMPLANT
SUT CHROMIC 3 0 SH 27 (SUTURE) IMPLANT
SUT ETHIBOND 0 (SUTURE) IMPLANT
SUT GUT CHROMIC 3 0 (SUTURE) IMPLANT
SUT MON AB 3-0 SH 27 (SUTURE) ×2
SUT MON AB 3-0 SH27 (SUTURE) ×2 IMPLANT
SUT VIC AB 4-0 P-3 18XBRD (SUTURE) IMPLANT
SUT VIC AB 4-0 P3 18 (SUTURE)
SUT VIC AB 4-0 PS2 18 (SUTURE) IMPLANT
SYR 20ML LL LF (SYRINGE) ×2 IMPLANT
SYR CONTROL 10ML LL (SYRINGE) ×2 IMPLANT
TOWEL OR 17X26 10 PK STRL BLUE (TOWEL DISPOSABLE) ×2 IMPLANT
TOWEL OR NON WOVEN STRL DISP B (DISPOSABLE) ×2 IMPLANT

## 2022-06-01 NOTE — Transfer of Care (Signed)
Immediate Anesthesia Transfer of Care Note  Patient: Jerry Carey  Procedure(s) Performed: ANAL EXAM UNDER ANESTHESIA AND LIGATION OF EXTERNAL HEMMORHOIDS (Anus)  Patient Location: PACU  Anesthesia Type:MAC  Level of Consciousness: awake, alert , and oriented  Airway & Oxygen Therapy: Patient Spontanous Breathing and Patient connected to face mask oxygen  Post-op Assessment: Report given to RN and Post -op Vital signs reviewed and stable  Post vital signs: Reviewed and stable  Last Vitals:  Vitals Value Taken Time  BP 122/66 06/01/22 1023  Temp 36.5 C 06/01/22 1023  Pulse 60 06/01/22 1024  Resp 9 06/01/22 1024  SpO2 98 % 06/01/22 1024  Vitals shown include unvalidated device data.  Last Pain:  Vitals:   06/01/22 1023  TempSrc:   PainSc: 0-No pain      Patients Stated Pain Goal: 3 (04/21/24 3664)  Complications: No notable events documented.

## 2022-06-01 NOTE — Progress Notes (Signed)
No labs needed per Dr. Sampson Goon .

## 2022-06-01 NOTE — Anesthesia Postprocedure Evaluation (Signed)
Anesthesia Post Note  Patient: Jerry Carey  Procedure(s) Performed: ANAL EXAM UNDER ANESTHESIA AND LIGATION OF EXTERNAL HEMMORHOIDS (Anus)     Patient location during evaluation: PACU Anesthesia Type: MAC Level of consciousness: awake and alert Pain management: pain level controlled Vital Signs Assessment: post-procedure vital signs reviewed and stable Respiratory status: spontaneous breathing, nonlabored ventilation and respiratory function stable Cardiovascular status: stable and blood pressure returned to baseline Postop Assessment: no apparent nausea or vomiting Anesthetic complications: no  No notable events documented.  Last Vitals:  Vitals:   06/01/22 1030 06/01/22 1045  BP: (!) 117/59 128/84  Pulse: (!) 55 (!) 57  Resp: 10 13  Temp:  (!) 36.4 C  SpO2: 100% 99%    Last Pain:  Vitals:   06/01/22 1045  TempSrc:   PainSc: 0-No pain                 Urban Naval,W. EDMOND

## 2022-06-01 NOTE — Interval H&P Note (Signed)
History and Physical Interval Note:  06/01/2022 8:48 AM  Jerry Carey  has presented today for surgery, with the diagnosis of ANAL PAIN.  The various methods of treatment have been discussed with the patient and family. After consideration of risks, benefits and other options for treatment, the patient has consented to  Procedure(s): ANAL EXAM UNDER ANESTHESIA (N/A) POSSIBLE CHEMICAL SPHINCTEROTOMY, POSSIBLE HEMORRHOIDOPEXY (N/A) as a surgical intervention.  The patient's history has been reviewed, patient examined, no change in status, stable for surgery.  I have reviewed the patient's chart and labs.  Questions were answered to the patient's satisfaction.     Vanita Panda, MD  Colorectal and General Surgery [facility]

## 2022-06-01 NOTE — Anesthesia Preprocedure Evaluation (Addendum)
Anesthesia Evaluation  Patient identified by MRN, date of birth, ID band Patient awake    Reviewed: Allergy & Precautions, H&P , NPO status , Patient's Chart, lab work & pertinent test results  Airway Mallampati: II  TM Distance: >3 FB Neck ROM: Full    Dental no notable dental hx. (+) Teeth Intact, Dental Advisory Given   Pulmonary sleep apnea , former smoker   Pulmonary exam normal breath sounds clear to auscultation       Cardiovascular negative cardio ROS  Rhythm:Regular Rate:Normal     Neuro/Psych negative neurological ROS  negative psych ROS   GI/Hepatic Neg liver ROS, hiatal hernia,GERD  Medicated,,  Endo/Other  negative endocrine ROS    Renal/GU negative Renal ROS  negative genitourinary   Musculoskeletal   Abdominal   Peds  Hematology negative hematology ROS (+)   Anesthesia Other Findings   Reproductive/Obstetrics negative OB ROS                             Anesthesia Physical Anesthesia Plan  ASA: 3  Anesthesia Plan: MAC   Post-op Pain Management: Tylenol PO (pre-op)* and Celebrex PO (pre-op)*   Induction: Intravenous  PONV Risk Score and Plan: 3 and Ondansetron, Propofol infusion and Midazolam  Airway Management Planned: Natural Airway and Simple Face Mask  Additional Equipment:   Intra-op Plan:   Post-operative Plan: Extubation in OR  Informed Consent: I have reviewed the patients History and Physical, chart, labs and discussed the procedure including the risks, benefits and alternatives for the proposed anesthesia with the patient or authorized representative who has indicated his/her understanding and acceptance.     Dental advisory given  Plan Discussed with: CRNA and Surgeon  Anesthesia Plan Comments:        Anesthesia Quick Evaluation

## 2022-06-01 NOTE — Discharge Instructions (Signed)
ANORECTAL SURGERY: POST OP INSTRUCTIONS Take your usually prescribed home medications unless otherwise directed. DIET: During the first few hours after surgery sip on some liquids until you are able to urinate.  It is normal to not urinate for several hours after this surgery.  If you feel uncomfortable, please contact the office for instructions.  After you are able to urinate,you may eat, if you feel like it.  Follow a light bland diet the first 24 hours after arrival home, such as soup, liquids, crackers, etc.  Be sure to include lots of fluids daily (6-8 glasses).  Avoid fast food or heavy meals, as your are more likely to get nauseated.  Eat a low fat diet the next few days after surgery.  Limit caffeine intake to 1-2 servings a day. PAIN CONTROL: Pain is best controlled by a usual combination of several different methods TOGETHER: Muscle relaxation  Soak in a warm bath (or Sitz bath) three times a day and after bowel movements.  Continue to do this until all pain is resolved. Over the counter pain medication Prescription pain medication, if needed Most patients will experience some swelling and discomfort in the anus/rectal area and incisions.  Heat such as warm towels, sitz baths, warm baths, etc to help relax tight/sore spots and speed recovery.  Some people prefer to use ice, especially in the first couple days after surgery, as it may decrease the pain and swelling, or alternate between ice & heat.  Experiment to what works for you.  Swelling and bruising can take several weeks to resolve.  Pain can take even longer to completely resolve. It is helpful to take an over-the-counter pain medication regularly for the first few weeks.  Choose one of the following that works best for you: Naproxen (Aleve, etc)  Two 220mg  tabs twice a day Ibuprofen (Advil, etc) Three 200mg  tabs four times a day (every meal & bedtime) A  prescription for pain medication (such as percocet, oxycodone, hydrocodone, etc)  should be given to you upon discharge.  Take your pain medication as prescribed.  If you are having problems/concerns with the prescription medicine (does not control pain, nausea, vomiting, rash, itching, etc), please call [phone] to see if we need to switch you to a different pain medicine that will work better for you and/or control your side effect better. If you need a refill on your pain medication, please contact your pharmacy.  They will contact our office to request authorization. Prescriptions will not be filled after 5 pm or on week-ends. KEEP YOUR BOWELS REGULAR and AVOID CONSTIPATION The goal is one to two soft bowel movements a day.  You should at least have a bowel movement every other day. Avoid getting constipated.  Between the surgery and the pain medications, it is common to experience some constipation. This can be very painful after rectal surgery.  Increasing fluid intake and taking a fiber supplement (such as Metamucil, Citrucel, FiberCon, etc) 1-2 times a day regularly will usually help prevent this problem from occurring.  A stool softener like colace is also recommended.  This can be purchased over the counter at your pharmacy.  You can take it up to 3 times a day.  If you do not have a bowel movement after 24 hrs since your surgery, take one does of milk of magnesia.  If you still haven't had a bowel movement 8-12 hours after that dose, take another dose.  If you don't have a bowel movement 48  hrs after surgery, purchase a Fleets enema from the drug store and administer gently per package instructions.  If you still are having trouble with your bowel movements after that, please call the office for further instructions. If you develop diarrhea or have many loose bowel movements, simplify your diet to bland foods & liquids for a few days.  Stop any stool softeners and decrease your fiber supplement.  Switching to mild anti-diarrheal medications (Kayopectate, Pepto Bismol)  can help.  If this worsens or does not improve, please call us.  Wound Care Remove your bandages before your first bowel movement or 8 hours after surgery.     Remove any wound packing material at this tim,e as well.  You do not need to repack the wound unless instructed otherwise.  Wear an absorbent pad or soft cotton gauze in your underwear to catch any drainage and help keep the area clean. You should change this every 2-3 hours while awake. Keep the area clean and dry.  Bathe / shower every day, especially after bowel movements.  Keep the area clean by showering / bathing over the incision / wound.   It is okay to soak an open wound to help wash it.  Wet wipes or showers / gentle washing after bowel movements is often less traumatic than regular toilet paper. You may have some styrofoam-like soft packing in the rectum which will come out with the first bowel movement.  You will often notice bleeding with bowel movements.  This should slow down by the end of the first week of surgery Expect some drainage.  This should slow down, too, by the end of the first week of surgery.  Wear an absorbent pad or soft cotton gauze in your underwear until the drainage stops. Do Not sit on a rubber or pillow ring.  This can make you symptoms worse.  You may sit on a soft pillow if needed.  ACTIVITIES as tolerated:   You may resume regular (light) daily activities beginning the next day--such as daily self-care, walking, climbing stairs--gradually increasing activities as tolerated.  If you can walk 30 minutes without difficulty, it is safe to try more intense activity such as jogging, treadmill, bicycling, low-impact aerobics, swimming, etc. Save the most intensive and strenuous activity for last such as sit-ups, heavy lifting, contact sports, etc  Refrain from any heavy lifting or straining until you are off narcotics for pain control.   You may drive when you are no longer taking prescription pain medication, you  can comfortably sit for long periods of time, and you can safely maneuver your car and apply brakes. You may have sexual intercourse when it is comfortable.  FOLLOW UP in our office Please call [facility] at [phone] to set up an appointment to see your surgeon in the office for a follow-up appointment approximately 3-4 weeks after your surgery. Make sure that you call for this appointment the day you arrive home to insure a convenient appointment time. 10. IF YOU HAVE DISABILITY OR FAMILY LEAVE FORMS, BRING THEM TO THE OFFICE FOR PROCESSING.  DO NOT GIVE THEM TO YOUR DOCTOR.     WHEN TO CALL us [phone]: Poor pain control Reactions / problems with new medications (rash/itching, nausea, etc)  Fever over 101.5 F (38.5 C) Inability to urinate Nausea and/or vomiting Worsening swelling or bruising Continued bleeding from incision. Increased pain, redness, or drainage from the incision  The clinic staff is available to answer your questions during regular business  hours (8:30am-5pm).  Please don't hesitate to call and ask to speak to one of our nurses for clinical concerns.   A surgeon from [facility] is always on call at the hospitals   If you have a medical emergency, go to the nearest emergency room or call 911.    [facility] [address], [address], [place], [place]  [address] ? MAIN: [phone] ? TOLL FREE: [phone] ? FAX [phone] [email or web address]

## 2022-06-01 NOTE — Op Note (Signed)
06/01/2022  10:14 AM  PATIENT:  Neldon Labella  51 y.o. male  Patient Care Team: Deland Pretty, MD as PCP - General (Internal Medicine) Buford Dresser, MD as PCP - Cardiology (Cardiology)  PRE-OPERATIVE DIAGNOSIS:  ANAL PAIN  POST-OPERATIVE DIAGNOSIS:  ANAL PAIN  PROCEDURE:  ANAL EXAM UNDER ANESTHESIA AND LIGATION OF INTERNAL HEMMORHOIDS   Surgeon(s): Leighton Ruff, MD  ASSISTANT: none   ANESTHESIA:   local and MAC  SPECIMEN:  No Specimen  DISPOSITION OF SPECIMEN:  N/A  COUNTS:  YES  PLAN OF CARE: Discharge to home after PACU  PATIENT DISPOSITION:  PACU - hemodynamically stable.  INDICATION: 51 y.o. M with severe rectal pain, unable to tolerate an examination in the office.  I recommended EUA with possible intervention   OR FINDINGS: Large grade 2 inflamed hemorrhoids at all 3 locations  DESCRIPTION: the patient was identified in the preoperative holding area and taken to the OR where they were laid on the operating room table.  MAC anesthesia was induced without difficulty. The patient was then positioned in prone jackknife position with buttocks gently taped apart.  The patient was then prepped and draped in usual sterile fashion.  SCDs were noted to be in place prior to the initiation of anesthesia. A surgical timeout was performed indicating the correct patient, procedure, positioning and need for preoperative antibiotics.  A rectal block was performed using Marcaine with epinephrine mixed with Experel.    I began with a digital rectal exam.  There were no masses or sphincter hypertension.  I then placed a Hill-Ferguson anoscope into the anal canal and evaluated this completely.  I evaluated the anal canal.  No anal fissure was noted.  No sphincter hypertension was noted.  The patient did have significantly prolapsing internal hemorrhoids that reduced.  Patient had inflamed external hemorrhoids as well.  A Hill-Ferguson anoscope was placed into the anal canal  and grade 2 hemorrhoids were noted on all 3 positions.  I decided to perform hemorrhoid ligation.  A rubber band ligator was used to ligate the left lateral and right anterior internal hemorrhoids.  Patient tolerated this well and was awakened from anesthesia and sent to the postanesthesia care unit in stable condition.  All counts were correct per operating room staff.  Rosario Adie, MD  Colorectal and [facility]

## 2022-06-02 ENCOUNTER — Encounter (HOSPITAL_COMMUNITY): Payer: Self-pay | Admitting: General Surgery

## 2022-06-27 ENCOUNTER — Ambulatory Visit: Payer: Self-pay | Admitting: General Surgery

## 2022-06-27 NOTE — H&P (Signed)
DOB: [birth year] DATE OF ENCOUNTER: 06/27/2022 Interval History:    52 year old male who presents to the office for evaluation of hemorrhoids.  He states that he has had external hemorrhoids for many years due to weight lifting and bodybuilding as a young adult.  They have not bothered him until approximately 11 to 12 months ago when he started developing anal pain and occasional bleeding.  This seems to wax and wane.  He has tried stool softeners and dietary changes with no real success.  His last colonoscopy was in October 2022 and showed a small anal fissure, diverticuli and nonbleeding internal and external hemorrhoids.  Patient was unable to tolerate anoscopy in the office and therefore exam under anesthesia was performed in early December 2023.  The patient was noted to have mildly inflamed external hemorrhoids, no fissures.  Grade 2 hemorrhoids were noted in all 3 positions.  A hemorrhoid rubber band ligation was performed to the left lateral and right anterior hemorrhoids.  He reports significant pain for the first week after the procedure but his symptoms have settled somewhat.  He continues to have pain after bowel movements with less bleeding.     Past Medical History:  Diagnosis Date   Abscess of left shoulder 06/24/2015   Coagulase-negative staphylococcal infection 07/12/2015   left shoulder wound   Complication of anesthesia    woke up angry and aggitated   GERD (gastroesophageal reflux disease)    History of hiatal hernia    History of stomach ulcers    Infection    left shoulder wound   NSVT (nonsustained ventricular tachycardia) (HCC) 2020   one episode followed Dr. Ladona Ridgel could not conform   Pectoralis muscle rupture    left   Pneumonia    Sleep apnea    Syncope    Wears glasses    Past Surgical History:  Procedure Laterality Date   APPLICATION OF WOUND VAC  07/15/2015   COLONOSCOPY     I & D EXTREMITY Left 07/21/2015   Procedure: IRRIGATION AND DEBRIDEMENT  EXTREMITY; LEFT SHOULDER;  Surgeon: Salvatore Marvel, MD;  Location: [facility] OR;  Service: Orthopedics;  Laterality: Left;   INCISION AND DRAINAGE Left 06/24/2015   Procedure: INCISION AND DRAINAGE LEFT PECTORALIS MUSCLE;  Surgeon: Teryl Lucy, MD;  Location: [facility];  Service: Orthopedics;  Laterality: Left;   INCISION AND DRAINAGE ABSCESS Left 07/15/2015   Procedure: OPEN INCISION AND DRAINAGE LEFT SHOULDER ;  Surgeon: Salvatore Marvel, MD;  Location: [facility] OR;  Service: Orthopedics;  Laterality: Left;   IRRIGATION AND DEBRIDEMENT ABSCESS Left 07/15/2015   LEFT SHOULDER   IRRIGATION AND DEBRIDEMENT SHOULDER Left 07/18/2015   Procedure: IRRIGATION AND DEBRIDEMENT , VERAFLO WOUND VAC PLACEMENT;  Surgeon: Myrene Galas, MD;  Location: [facility] OR;  Service: Orthopedics;  Laterality: Left;   PECTORALIS TENDON REPAIR Left 06/03/2015   Procedure: LEFT PECTORALIS TENDON REPAIR;  Surgeon: Salvatore Marvel, MD;  Location: [facility];  Service: Orthopedics;  Laterality: Left;   RECTAL EXAM UNDER ANESTHESIA N/A 06/01/2022   Procedure: ANAL EXAM UNDER ANESTHESIA AND LIGATION OF EXTERNAL HEMMORHOIDS;  Surgeon: Romie Levee, MD;  Location: [facility];  Service: General;  Laterality: N/A;   TONSILLECTOMY     WISDOM TOOTH EXTRACTION     Social History   Socioeconomic History   Marital status: Married    Spouse name: Not on file   Number of children: Not on file   Years of education: Not on file   Highest education level:  Not on file  Occupational History   Not on file  Tobacco Use   Smoking status: Former    Types: Cigarettes    Quit date: 11/28/2008    Years since quitting: 13.5   Smokeless tobacco: Never  Vaping Use   Vaping Use: Former   Start date: 11/24/2017   Quit date: 05/03/2020  Substance and Sexual Activity   Alcohol use: Not Currently    Comment: social   Drug use: Yes    Types: Marijuana    Comment: CBD   Sexual activity: Not on file  Other Topics Concern   Not on file   Social History Narrative   Not on file   Social Determinants of Health   Financial Resource Strain: Not on file  Food Insecurity: Not on file  Transportation Needs: Not on file  Physical Activity: Not on file  Stress: Not on file  Social Connections: Not on file  Intimate Partner Violence: Not on file   Family History  Problem Relation Age of Onset   Hypothyroidism Mother    Meniere's disease Mother    Parkinson's disease Father    Colon cancer Paternal Uncle    Pancreatic cancer Neg Hx    Liver cancer Neg Hx    Rectal cancer Neg Hx     Current Outpatient Medications:    AMBULATORY NON FORMULARY MEDICATION, Medication Name: .Secondary school teacher, Disp: 30 g, Rfl: 0   diazepam (VALIUM) 5 MG tablet, Take 5 mg by mouth 2 (two) times daily as needed for anxiety [employer])., Disp: , Rfl:    esomeprazole (NEXIUM) 40 MG capsule, Take 1 capsule (40 mg total) by mouth daily before breakfast. Take at least 30 minutes before eating, Disp: 30 capsule, Rfl: 1   famotidine (PEPCID) 20 MG tablet, Take 20 mg by mouth daily as needed (Acid reflux)., Disp: , Rfl:    hydrocortisone (ANUSOL-HC) 2.5 % rectal cream, Place 1 Application rectally 2 (two) times daily., Disp: 30 g, Rfl: 1   ibuprofen (ADVIL) 800 MG tablet, Take 800 mg by mouth every 8 (eight) hours as needed for moderate pain or mild pain., Disp: , Rfl:    Lidocaine, Anorectal, (RECTICARE) 5 % CREA, Place 1 application  rectally daily as needed (hemorrhoids)., Disp: , Rfl:    Multiple Vitamins-Minerals (EMERGEN-C IMMUNE) PACK, Take 1 Package by mouth daily. 1000 mg, Disp: , Rfl:    OVER THE COUNTER MEDICATION, Take 1 tablet by mouth daily as needed (Nasal congestion). severe congestion relief, Disp: , Rfl:    Probiotic Product (PROBIOTIC PO), Take 2 tablets by mouth daily. 1 billion CFU, Disp: , Rfl:    sildenafil (VIAGRA) 100 MG tablet, Take 100 mg by mouth as needed for erectile dysfunction. 1/2-1 tablet prn, Disp: , Rfl:    traMADol (ULTRAM)  50 MG tablet, Take 1-2 tablets (50-100 mg total) by mouth every 8 (eight) hours as needed., Disp: 10 tablet, Rfl: 0   Wheat Dextrin (BENEFIBER DRINK MIX PO), Take 10 mLs by mouth daily., Disp: , Rfl:  Allergies  Allergen Reactions   Chlorhexidine Gluconate Rash    Patient states he had severe burning and rash and was given benadryl   Review of Systems - Negative except as stated above     Physical Examination:    Gen: NAD  CV: RRR Lungs: CTA Rectal: Noninflamed external skin tags, no prolapse noted     Procedure: Anoscopy Surgeon: Marcello Moores After the risks and benefits were explained, written consent was obtained for above  procedure.  A medical assistant chaperone was present thoroughout the entire procedure.  Anesthesia: none Diagnosis: anal pain and bleeding Findings: grade 2 L lateral hemorrhoid, RA and RP grade 2   Assessment and Plan:    Jerry Carey is a 52 y.o. male who underwent EUA and rubber band ligation on June 01, 2022.  He seems to have had some success with the banding.  We discussed proceeding with a second round of rubber band ligations versus proceeding with hemorrhoid surgery.  The patient would like to avoid surgery at all cost, and agreed to proceed with rubber band ligation.  We attempted this today but he was unable to tolerate a anoscopy for more than approximately 20 seconds.  We we discussed proceeding with hemorrhoid ligation as a better option.  We discussed the typical healing time associated with hemorrhoid surgery and the need for time off work.  Patient agreed to proceed with [facility].  Risks include bleeding, pain, recurrence and urinary retention   Diagnoses and all orders for this visit:   Grade II hemorrhoids         No follow-ups on file.     The plan was discussed in detail with the patient today, who expressed understanding.  The patient has my contact information, and understands to call me with any additional questions or concerns in the  interval.  I would be happy to see the patient back sooner if the need arises.    Rosario Adie, MD Colon and Rectal Surgery [facility]

## 2022-07-04 ENCOUNTER — Other Ambulatory Visit: Payer: Self-pay | Admitting: Gastroenterology

## 2022-07-04 DIAGNOSIS — K219 Gastro-esophageal reflux disease without esophagitis: Secondary | ICD-10-CM

## 2022-07-04 DIAGNOSIS — K602 Anal fissure, unspecified: Secondary | ICD-10-CM

## 2022-07-04 DIAGNOSIS — K209 Esophagitis, unspecified without bleeding: Secondary | ICD-10-CM

## 2022-07-04 DIAGNOSIS — K449 Diaphragmatic hernia without obstruction or gangrene: Secondary | ICD-10-CM

## 2022-07-04 DIAGNOSIS — K2210 Ulcer of esophagus without bleeding: Secondary | ICD-10-CM

## 2022-07-04 DIAGNOSIS — K625 Hemorrhage of anus and rectum: Secondary | ICD-10-CM

## 2023-01-25 ENCOUNTER — Other Ambulatory Visit (HOSPITAL_BASED_OUTPATIENT_CLINIC_OR_DEPARTMENT_OTHER): Payer: Self-pay | Admitting: Internal Medicine

## 2023-01-25 DIAGNOSIS — Z8679 Personal history of other diseases of the circulatory system: Secondary | ICD-10-CM

## 2023-02-13 ENCOUNTER — Ambulatory Visit: Payer: 59 | Attending: Cardiovascular Disease | Admitting: Cardiovascular Disease

## 2023-02-13 ENCOUNTER — Encounter: Payer: Self-pay | Admitting: Cardiovascular Disease

## 2023-02-13 VITALS — BP 110/80 | HR 90 | Ht 73.0 in | Wt 223.6 lb

## 2023-02-13 DIAGNOSIS — E782 Mixed hyperlipidemia: Secondary | ICD-10-CM | POA: Diagnosis not present

## 2023-02-13 DIAGNOSIS — R55 Syncope and collapse: Secondary | ICD-10-CM | POA: Diagnosis not present

## 2023-02-13 NOTE — Progress Notes (Unsigned)
  Cardiology Office Note:  .   Date:  02/15/2023  ID:  Jerry Carey, DOB 1970/10/31, MRN [id number] PCP: Merri Brunette, MD  [facility] Providers Cardiologist:  Jodelle Red, MD {  History of Present Illness: .   Jerry Carey is a 52 y.o. male pt with hx of syncope Has seen Dr. Alcide Clever in the past,  Saw Dr. Ladona Ridgel for 2nd opinion   Seen with wife,Jerry Carey,  In 2020  Had an episode of near syncope where he was entertaining   Was fond to have SVT at 200 bpm  Had a coronary CT angiogram which revealed a coronary calcium score of 0.  He had normal coronary arteries.  Aortic root was normal.  Echo in 2020 showed normal left ventricular systolic function with EF greater than 65%.  He has mild LVH.  Normal diastolic function.  About a year , had a near syncope episode. 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         Risk Assessment/Calculations:          Physical Exam:   VS:  BP 110/80   Pulse 90   Ht 6\' 1"  (1.854 m)   Wt 223 lb 9.6 oz (101.4 kg)   SpO2 97%   BMI 29.50 kg/m    Wt Readings from Last 3 Encounters:  02/13/23 223 lb 9.6 oz (101.4 kg)  05/31/22 232 lb (105.2 kg)  05/11/22 228 lb (103.4 kg)    GEN: Well nourished, well developed in no acute distress NECK: No JVD; No carotid bruits CARDIAC: RRR, no murmurs, rubs, gallops RESPIRATORY:  Clear to auscultation without rales, wheezing or rhonchi  ABDOMEN: Soft, non-tender, non-distended EXTREMITIES:  No edema; No deformity   ASSESSMENT AND PLAN: .     Near syncope:  [name] presents for follow-up visit.  He had an episode of near syncope 4 years ago.   He has had some very minor episodes last year.  He has not had anything recently.  He had an event monitor that I was not able to locate that apparently revealed supraventricular tachycardia.  He sits on the desk 14 hours a day.  He works in Insurance underwriter.  He admits to eating a poor diet.  I have asked him to work on a better diet.  He will see [place] in 1 year        Dispo:   Signed, Kristeen Miss, MD

## 2023-02-13 NOTE — Patient Instructions (Signed)

## 2023-02-15 ENCOUNTER — Encounter: Payer: Self-pay | Admitting: Cardiovascular Disease

## 2023-03-30 ENCOUNTER — Other Ambulatory Visit: Payer: Self-pay | Admitting: Internal Medicine

## 2023-03-30 DIAGNOSIS — Z8679 Personal history of other diseases of the circulatory system: Secondary | ICD-10-CM

## 2024-01-30 ENCOUNTER — Other Ambulatory Visit (HOSPITAL_BASED_OUTPATIENT_CLINIC_OR_DEPARTMENT_OTHER): Payer: Self-pay | Admitting: Internal Medicine

## 2024-01-30 DIAGNOSIS — Z8249 Family history of ischemic heart disease and other diseases of the circulatory system: Secondary | ICD-10-CM

## 2024-01-30 DIAGNOSIS — D75839 Thrombocytosis, unspecified: Secondary | ICD-10-CM

## 2024-01-31 LAB — LAB REPORT - SCANNED: EGFR: 104

## 2024-02-26 ENCOUNTER — Other Ambulatory Visit (HOSPITAL_BASED_OUTPATIENT_CLINIC_OR_DEPARTMENT_OTHER)

## 2024-02-26 ENCOUNTER — Encounter (HOSPITAL_BASED_OUTPATIENT_CLINIC_OR_DEPARTMENT_OTHER): Payer: Self-pay

## 2024-04-28 ENCOUNTER — Ambulatory Visit: Payer: Self-pay | Admitting: General Surgery

## 2024-04-28 NOTE — H&P (Signed)
  PROVIDER:  BERNARDA WANDA NED, MD  MRN: [id number] DOB: [birth year] DATE OF ENCOUNTER: 04/28/2024 Interval History:   53 year old male who presents to the office for evaluation of hemorrhoids.  He states that he has had external hemorrhoids for many years due to weight lifting and bodybuilding as a young adult.  They have not bothered him until approximately 11 to 12 months ago when he started developing anal pain and occasional bleeding.  This seems to wax and wane.  He has tried stool softeners and dietary changes with no real success.  His last colonoscopy was in October 2022 and showed a small anal fissure, diverticuli and nonbleeding internal and external hemorrhoids.  Patient was unable to tolerate anoscopy in the office and therefore exam under anesthesia was performed in early December 2023.  The patient was noted to have mildly inflamed external hemorrhoids, no fissures.  Grade 2 hemorrhoids were noted in all 3 positions.  A hemorrhoid rubber band ligation was performed to the left lateral and right anterior hemorrhoids.  He reported significant pain for the first week after the procedure.  He continued to have pain and bleeding.  We attempted rubber band ligation again but he did not tolerate this.  We discussed proceeding with a hemorrhoidal ligation procedure.  He did not want to do this in 2023.  He is here again today to discuss this further.    Physical Examination:   Gen: NAD  Rectal: Noninflamed external skin tags, no prolapse noted   Procedure: Anoscopy Dec 2023 Surgeon: Ned After the risks and benefits were explained, written consent was obtained for above procedure.  A medical assistant chaperone was present thoroughout the entire procedure.  Anesthesia: none Diagnosis: anal pain and bleeding Findings: grade 2 L lateral hemorrhoid, RA and RP grade 2  Assessment and Plan:   Jerry Carey is a 53 y.o. male who underwent EUA and rubber band ligation on June 01, 2022.    We we discussed proceeding with hemorrhoid ligation as a better option.  We discussed the typical healing time associated with hemorrhoid surgery and the need for time off work.  Patient agreed to proceed with [facility].  Risks include bleeding, pain, recurrence and urinary retention  Diagnoses and all orders for this visit:  Grade II hemorrhoids     The plan was discussed in detail with the patient today, who expressed understanding.  The patient has my contact information, and understands to call me with any additional questions or concerns in the interval.  I would be happy to see the patient back sooner if the need arises.   Bernarda JAYSON Ned, MD Colon and Rectal Surgery [facility]

## 2024-06-06 NOTE — Patient Instructions (Addendum)
 SURGICAL WAITING ROOM VISITATION  Patients having surgery or a procedure may have no more than 2 support people in the waiting area - these visitors may rotate.    Children ages 61 and under will not be able to visit patients in [facility] under most circumstances.   Visitors with respiratory illnesses are discouraged from visiting and should remain at home.  If the patient needs to stay at the hospital during part of their recovery, the visitor guidelines for inpatient rooms apply. Pre-op nurse will coordinate an appropriate time for 1 support person to accompany patient in pre-op.  This support person may not rotate.    Please refer to the [facility] website for the visitor guidelines for Inpatients (after your surgery is over and you are in a regular room).    Your procedure is scheduled on: 06/25/24   Report to [facility] Main Entrance    Report to admitting at 6:15 AM   Call this number if you have problems the morning of surgery [phone]   Do not eat food or drink liquids :After Midnight.          If you have questions, please contact your surgeons office.   FOLLOW BOWEL PREP AND ANY ADDITIONAL PRE OP INSTRUCTIONS YOU RECEIVED FROM YOUR SURGEON'S OFFICE!!!     Oral Hygiene is also important to reduce your risk of infection.                                    Remember - BRUSH YOUR TEETH THE MORNING OF SURGERY WITH YOUR REGULAR TOOTHPASTE  DENTURES WILL BE REMOVED PRIOR TO SURGERY PLEASE DO NOT APPLY Poly grip OR ADHESIVES!!!   Stop all vitamins and herbal supplements 7 days before surgery.   Take these medicines the morning of surgery with A SIP OF WATER: Diazepam, Nexium , Pepcid , Tramadol               You may not have any metal on your body including jewelry, and body piercing             Do not wear lotions, powders, cologne, or deodorant              Men may shave face and neck.   Do not bring valuables to the hospital. [facility] IS  NOT             RESPONSIBLE   FOR VALUABLES.   Contacts, glasses, dentures or bridgework may not be worn into surgery.   DO NOT BRING YOUR HOME MEDICATIONS TO THE HOSPITAL. PHARMACY WILL DISPENSE MEDICATIONS LISTED ON YOUR MEDICATION LIST TO YOU DURING YOUR ADMISSION IN THE HOSPITAL!    Patients discharged on the day of surgery will not be allowed to drive home.  Someone NEEDS to stay with you for the first 24 hours after anesthesia.   Special Instructions: Bring a copy of your healthcare power of attorney and living will documents the day of surgery if you haven't scanned them before.              Please read over the following fact sheets you were given: IF YOU HAVE QUESTIONS ABOUT YOUR PRE-OP INSTRUCTIONS PLEASE CALL [phone] [place].   If you received a COVID test during your pre-op visit  it is requested that you wear a mask when out in public, stay away from anyone that may not be feeling well and  notify your surgeon if you develop symptoms. If you test positive for Covid or have been in contact with anyone that has tested positive in the last 10 days please notify you surgeon.    [facility] - Preparing for Surgery Before surgery, you can play an important role.  Because skin is not sterile, your skin needs to be as free of germs as possible.  You can reduce the number of germs on your skin by washing with CHG (chlorahexidine gluconate) soap before surgery.  CHG is an antiseptic cleaner which kills germs and bonds with the skin to continue killing germs even after washing. Please DO NOT use if you have an allergy to CHG or antibacterial soaps.  If your skin becomes reddened/irritated stop using the CHG and inform your nurse when you arrive at Short Stay. Do not shave (including legs and underarms) for at least 48 hours prior to the first CHG shower.  You may shave your face/neck.  Please follow these instructions carefully:  1.  Shower with CHG Soap the night before surgery ONLY (DO  NOT USE THE SOAP THE MORNING OF SURGERY).  2.  If you choose to wash your hair, wash your hair first as usual with your normal  shampoo.  3.  After you shampoo, rinse your hair and body thoroughly to remove the shampoo.                             4.  Use CHG as you would any other liquid soap.  You can apply chg directly to the skin and wash.  Gently with a scrungie or clean washcloth.  5.  Apply the CHG Soap to your body ONLY FROM THE NECK DOWN.   Do   not use on face/ open                           Wound or open sores. Avoid contact with eyes, ears mouth and   genitals (private parts).                       Wash face,  Genitals (private parts) with your normal soap.             6.  Wash thoroughly, paying special attention to the area where your    surgery  will be performed.  7.  Thoroughly rinse your body with warm water from the neck down.  8.  DO NOT shower/wash with your normal soap after using and rinsing off the CHG Soap.                9.  Pat yourself dry with a clean towel.            10.  Wear clean pajamas.            11.  Place clean sheets on your bed the night of your first shower and do not  sleep with pets. Day of Surgery : Do not apply any CHG, lotions/deodorants the morning of surgery.  Please wear clean clothes to the hospital/surgery center.  FAILURE TO FOLLOW THESE INSTRUCTIONS MAY RESULT IN THE CANCELLATION OF YOUR SURGERY  PATIENT SIGNATURE_________________________________  NURSE SIGNATURE__________________________________  ________________________________________________________________________

## 2024-06-06 NOTE — Progress Notes (Signed)
 Date of COVID positive in last 90 days:  PCP - Ryan Hives, MD Cardiologist - Shelda Bruckner, MD  Chest x-ray - N/A EKG - N/A Stress Test - N/A ECHO -  08/05/18 Epic Cardiac Cath - N/A Pacemaker/ICD device last checked:N/A Spinal Cord Stimulator:N/A  Bowel Prep - N/A  Sleep Study - yes CPAP - not using currently  Fasting Blood Sugar - N/A Checks Blood Sugar _____ times a day  Last dose of GLP1 agonist-  N/A GLP1 instructions:  Do not take after     Last dose of SGLT-2 inhibitors-  N/A SGLT-2 instructions:  Do not take after     Blood Thinner Instructions: N/A Last dose:   Time: Aspirin Instructions:N/A Last Dose:  Activity level: Can go up a flight of stairs and perform activities of daily living without stopping and without symptoms of chest pain or shortness of breath.   Anesthesia review: near syncope years ago was following up with cards- last saw 2024- no episodes since 2024, OSA  Patient denies shortness of breath, fever, cough and chest pain at PAT appointment  Patient verbalized understanding of instructions that were given to them at the PAT appointment. Patient was also instructed that they will need to review over the PAT instructions again at home before surgery.

## 2024-06-09 ENCOUNTER — Other Ambulatory Visit: Payer: Self-pay

## 2024-06-09 ENCOUNTER — Encounter (HOSPITAL_COMMUNITY)
Admission: RE | Admit: 2024-06-09 | Discharge: 2024-06-09 | Disposition: A | Source: Ambulatory Visit | Attending: General Surgery

## 2024-06-09 ENCOUNTER — Encounter (HOSPITAL_COMMUNITY): Payer: Self-pay

## 2024-06-09 VITALS — BP 134/91 | HR 61 | Temp 98.6°F | Resp 16 | Ht 73.0 in | Wt 214.0 lb

## 2024-06-09 DIAGNOSIS — Z01818 Encounter for other preprocedural examination: Secondary | ICD-10-CM

## 2024-06-09 DIAGNOSIS — Z01812 Encounter for preprocedural laboratory examination: Secondary | ICD-10-CM | POA: Diagnosis present

## 2024-06-09 LAB — CBC
HCT: 39.5 % (ref 39.0–52.0)
Hemoglobin: 13.1 g/dL (ref 13.0–17.0)
MCH: 28.8 pg (ref 26.0–34.0)
MCHC: 33.2 g/dL (ref 30.0–36.0)
MCV: 86.8 fL (ref 80.0–100.0)
Platelets: 350 K/uL (ref 150–400)
RBC: 4.55 MIL/uL (ref 4.22–5.81)
RDW: 13.3 % (ref 11.5–15.5)
WBC: 5.9 K/uL (ref 4.0–10.5)
nRBC: 0 % (ref 0.0–0.2)

## 2024-06-12 ENCOUNTER — Encounter (HOSPITAL_COMMUNITY): Admission: RE | Admit: 2024-06-12

## 2024-06-25 ENCOUNTER — Ambulatory Visit (HOSPITAL_COMMUNITY): Payer: Self-pay | Admitting: Medical

## 2024-06-25 ENCOUNTER — Encounter (HOSPITAL_COMMUNITY)
Admission: RE | Disposition: A | Discharge status: Home / Self Care | Payer: Self-pay | Source: Ambulatory Visit | Attending: General Surgery

## 2024-06-25 ENCOUNTER — Ambulatory Visit (HOSPITAL_COMMUNITY): Admitting: Anesthesiology

## 2024-06-25 ENCOUNTER — Encounter (HOSPITAL_COMMUNITY): Payer: Self-pay | Admitting: General Surgery

## 2024-06-25 ENCOUNTER — Ambulatory Visit (HOSPITAL_COMMUNITY)
Admission: RE | Admit: 2024-06-25 | Discharge: 2024-06-25 | Disposition: A | Discharge status: Home / Self Care | Source: Ambulatory Visit | Attending: General Surgery | Admitting: General Surgery

## 2024-06-25 DIAGNOSIS — K648 Other hemorrhoids: Secondary | ICD-10-CM | POA: Diagnosis not present

## 2024-06-25 DIAGNOSIS — Z87891 Personal history of nicotine dependence: Secondary | ICD-10-CM | POA: Diagnosis not present

## 2024-06-25 DIAGNOSIS — K219 Gastro-esophageal reflux disease without esophagitis: Secondary | ICD-10-CM | POA: Diagnosis not present

## 2024-06-25 DIAGNOSIS — K641 Second degree hemorrhoids: Secondary | ICD-10-CM | POA: Insufficient documentation

## 2024-06-25 DIAGNOSIS — G4730 Sleep apnea, unspecified: Secondary | ICD-10-CM | POA: Diagnosis not present

## 2024-06-25 HISTORY — PX: TRANSANAL HEMORRHOIDAL DEARTERIALIZATION: SHX6136

## 2024-06-25 LAB — GLUCOSE, CAPILLARY: Glucose-Capillary: 109 mg/dL — ABNORMAL HIGH (ref 70–99)

## 2024-06-25 SURGERY — TRANSANAL HEMORRHOIDAL DEARTERIALIZATION
Anesthesia: Monitor Anesthesia Care

## 2024-06-25 MED ORDER — LIDOCAINE HCL (PF) 2 % IJ SOLN
INTRAMUSCULAR | Status: AC
  Filled 2024-06-25: qty 5

## 2024-06-25 MED ORDER — DIAZEPAM 2 MG PO TABS: 5.0000 mg | ORAL_TABLET | Freq: Once | ORAL | Status: DC | PRN

## 2024-06-25 MED ORDER — CELECOXIB 200 MG PO CAPS
200.0000 mg | ORAL_CAPSULE | ORAL | Status: AC
  Administered 2024-06-25: 200 mg via ORAL
  Filled 2024-06-25: qty 1

## 2024-06-25 MED ORDER — HYDROMORPHONE HCL 1 MG/ML IJ SOLN: 0.2500 mg | INTRAMUSCULAR | Status: DC | PRN

## 2024-06-25 MED ORDER — FENTANYL CITRATE (PF) 100 MCG/2ML IJ SOLN
INTRAMUSCULAR | Status: DC | PRN
  Administered 2024-06-25 (×2): 50 ug via INTRAVENOUS

## 2024-06-25 MED ORDER — FENTANYL CITRATE (PF) 100 MCG/2ML IJ SOLN
INTRAMUSCULAR | Status: AC
  Filled 2024-06-25: qty 2

## 2024-06-25 MED ORDER — BUPIVACAINE LIPOSOME 1.3 % IJ SUSP
INTRAMUSCULAR | Status: AC
  Filled 2024-06-25: qty 20

## 2024-06-25 MED ORDER — PHENYLEPHRINE 80 MCG/ML (10ML) SYRINGE FOR IV PUSH (FOR BLOOD PRESSURE SUPPORT)
PREFILLED_SYRINGE | INTRAVENOUS | Status: AC
  Filled 2024-06-25: qty 10

## 2024-06-25 MED ORDER — LACTATED RINGERS IV SOLN: INTRAVENOUS | Status: DC

## 2024-06-25 MED ORDER — DEXMEDETOMIDINE HCL IN NACL 80 MCG/20ML IV SOLN
INTRAVENOUS | Status: DC | PRN
  Administered 2024-06-25: 4 ug via INTRAVENOUS
  Administered 2024-06-25 (×2): 8 ug via INTRAVENOUS

## 2024-06-25 MED ORDER — GABAPENTIN 300 MG PO CAPS
300.0000 mg | ORAL_CAPSULE | ORAL | Status: AC
  Administered 2024-06-25: 300 mg via ORAL
  Filled 2024-06-25: qty 1

## 2024-06-25 MED ORDER — BUPIVACAINE-EPINEPHRINE (PF) 0.5% -1:200000 IJ SOLN
INTRAMUSCULAR | Status: AC
  Filled 2024-06-25: qty 30

## 2024-06-25 MED ORDER — MIDAZOLAM HCL 5 MG/5ML IJ SOLN
INTRAMUSCULAR | Status: DC | PRN
  Administered 2024-06-25: 2 mg via INTRAVENOUS

## 2024-06-25 MED ORDER — OXYCODONE HCL 5 MG PO TABS
ORAL_TABLET | ORAL | Status: AC
  Filled 2024-06-25: qty 1

## 2024-06-25 MED ORDER — PROPOFOL 10 MG/ML IV BOLUS
INTRAVENOUS | Status: DC | PRN
  Administered 2024-06-25 (×2): 20 mg via INTRAVENOUS

## 2024-06-25 MED ORDER — MIDAZOLAM HCL 2 MG/2ML IJ SOLN
INTRAMUSCULAR | Status: AC
  Filled 2024-06-25: qty 2

## 2024-06-25 MED ORDER — BUPIVACAINE LIPOSOME 1.3 % IJ SUSP: 20.0000 mL | Freq: Once | INTRAMUSCULAR | Status: DC

## 2024-06-25 MED ORDER — PROPOFOL 1000 MG/100ML IV EMUL
INTRAVENOUS | Status: AC
  Filled 2024-06-25: qty 100

## 2024-06-25 MED ORDER — OXYCODONE HCL 5 MG PO TABS: 5.0000 mg | ORAL_TABLET | Freq: Four times a day (QID) | ORAL | 0 refills | Status: DC | PRN

## 2024-06-25 MED ORDER — 0.9 % SODIUM CHLORIDE (POUR BTL) OPTIME
TOPICAL | Status: DC | PRN
  Administered 2024-06-25: 1000 mL

## 2024-06-25 MED ORDER — LIDOCAINE HCL (PF) 2 % IJ SOLN
INTRAMUSCULAR | Status: DC | PRN
  Administered 2024-06-25: 100 mg via INTRADERMAL

## 2024-06-25 MED ORDER — AMISULPRIDE (ANTIEMETIC) 5 MG/2ML IV SOLN
INTRAVENOUS | Status: AC
  Filled 2024-06-25: qty 4

## 2024-06-25 MED ORDER — OXYCODONE HCL 5 MG PO TABS
5.0000 mg | ORAL_TABLET | Freq: Once | ORAL | Status: AC | PRN
  Administered 2024-06-25: 5 mg via ORAL

## 2024-06-25 MED ORDER — AMISULPRIDE (ANTIEMETIC) 5 MG/2ML IV SOLN
10.0000 mg | Freq: Once | INTRAVENOUS | Status: AC | PRN
  Administered 2024-06-25: 10 mg via INTRAVENOUS

## 2024-06-25 MED ORDER — SODIUM CHLORIDE 0.9% FLUSH: 3.0000 mL | Freq: Two times a day (BID) | INTRAVENOUS | Status: DC

## 2024-06-25 MED ORDER — CHLORHEXIDINE GLUCONATE 0.12 % MT SOLN: 15.0000 mL | Freq: Once | OROMUCOSAL | Status: DC

## 2024-06-25 MED ORDER — ORAL CARE MOUTH RINSE: 15.0000 mL | Freq: Once | OROMUCOSAL | Status: DC

## 2024-06-25 MED ORDER — OXYCODONE HCL 5 MG/5ML PO SOLN: 5.0000 mg | Freq: Once | ORAL | Status: AC | PRN

## 2024-06-25 MED ORDER — ACETAMINOPHEN 500 MG PO TABS
1000.0000 mg | ORAL_TABLET | ORAL | Status: AC
  Administered 2024-06-25: 1000 mg via ORAL
  Filled 2024-06-25: qty 2

## 2024-06-25 MED ORDER — ONDANSETRON HCL 4 MG PO TABS: 4.0000 mg | ORAL_TABLET | Freq: Three times a day (TID) | ORAL | 0 refills | Status: AC | PRN

## 2024-06-25 MED ORDER — PROPOFOL 500 MG/50ML IV EMUL
INTRAVENOUS | Status: DC | PRN
  Administered 2024-06-25: 200 ug/kg/min via INTRAVENOUS

## 2024-06-25 MED ORDER — ONDANSETRON HCL 4 MG/2ML IJ SOLN: 4.0000 mg | Freq: Once | INTRAMUSCULAR | Status: DC | PRN

## 2024-06-25 MED ORDER — PROPOFOL 10 MG/ML IV BOLUS
INTRAVENOUS | Status: AC
  Filled 2024-06-25: qty 20

## 2024-06-25 MED ORDER — ONDANSETRON HCL 4 MG/2ML IJ SOLN
INTRAMUSCULAR | Status: DC | PRN
  Administered 2024-06-25: 4 mg via INTRAVENOUS

## 2024-06-25 MED ORDER — DIAZEPAM 5 MG PO TABS: 5.0000 mg | ORAL_TABLET | Freq: Four times a day (QID) | ORAL | 0 refills | Status: AC | PRN

## 2024-06-25 MED ORDER — PHENYLEPHRINE 80 MCG/ML (10ML) SYRINGE FOR IV PUSH (FOR BLOOD PRESSURE SUPPORT)
PREFILLED_SYRINGE | INTRAVENOUS | Status: DC | PRN
  Administered 2024-06-25: 80 ug via INTRAVENOUS
  Administered 2024-06-25 (×2): 160 ug via INTRAVENOUS

## 2024-06-25 MED ORDER — BUPIVACAINE-EPINEPHRINE (PF) 0.25% -1:200000 IJ SOLN
INTRAMUSCULAR | Status: DC | PRN
  Administered 2024-06-25: 50 mL

## 2024-06-25 MED ORDER — ACETAMINOPHEN 500 MG PO TABS: 1000.0000 mg | ORAL_TABLET | Freq: Once | ORAL | Status: DC

## 2024-06-25 SURGICAL SUPPLY — 29 items
BAG COUNTER SPONGE SURGICOUNT (BAG) IMPLANT
BLADE HEX COATED 2.75 (ELECTRODE) ×1 IMPLANT
BRIEF MESH DISP LRG (UNDERPADS AND DIAPERS) IMPLANT
COVER SURGICAL LIGHT HANDLE (MISCELLANEOUS) ×1 IMPLANT
DRAPE SHEET LG 3/4 BI-LAMINATE (DRAPES) ×1 IMPLANT
DRAPE UNDERBUTTOCKS STRL (DISPOSABLE) ×1 IMPLANT
ELECT REM PT RETURN 15FT ADLT (MISCELLANEOUS) ×1 IMPLANT
GAUZE 4X4 16PLY ~~LOC~~+RFID DBL (SPONGE) ×1 IMPLANT
GAUZE PAD ABD 8X10 STRL (GAUZE/BANDAGES/DRESSINGS) IMPLANT
GAUZE SPONGE 4X4 12PLY STRL (GAUZE/BANDAGES/DRESSINGS) IMPLANT
GLOVE BIO SURGEON STRL SZ 6.5 (GLOVE) ×1 IMPLANT
GLOVE INDICATOR 6.5 STRL GRN (GLOVE) ×2 IMPLANT
GOWN STRL REUS W/ TWL XL LVL3 (GOWN DISPOSABLE) ×2 IMPLANT
HEMOSTAT SURGICEL 4X8 (HEMOSTASIS) IMPLANT
KIT SLIDE ONE PROLAPS HEMORR (KITS) IMPLANT
KIT TURNOVER KIT A (KITS) ×1 IMPLANT
NEEDLE HYPO 22X1.5 SAFETY MO (MISCELLANEOUS) ×1 IMPLANT
PACK LITHOTOMY IV (CUSTOM PROCEDURE TRAY) ×1 IMPLANT
PENCIL SMOKE EVACUATOR (MISCELLANEOUS) ×1 IMPLANT
SPIKE FLUID TRANSFER (MISCELLANEOUS) ×1 IMPLANT
SPONGE HEMORRHOID 8X3CM (HEMOSTASIS) IMPLANT
SPONGE SURGIFOAM ABS GEL 100 (HEMOSTASIS) IMPLANT
SURGILUBE 2OZ TUBE FLIPTOP (MISCELLANEOUS) ×1 IMPLANT
SUT CHROMIC 2 0 SH (SUTURE) IMPLANT
SUT CHROMIC 3 0 SH 27 (SUTURE) IMPLANT
SUT VIC AB 2-0 UR6 27 (SUTURE) IMPLANT
SYR 20ML LL LF (SYRINGE) ×1 IMPLANT
TOWEL OR DSP ST BLU DLX 10/PK (DISPOSABLE) ×1 IMPLANT
YANKAUER SUCT BULB TIP 10FT TU (MISCELLANEOUS) ×1 IMPLANT

## 2024-06-25 NOTE — Progress Notes (Signed)
 Was the fall witnessed: yes  Patient condition before and after the fall: Patient Alert and oriented x 4, was ambulating with stand by assist, patient got light headed and legs gave out. He was assisted to the floor. After the fall he was pale, diaphoretic, had a pulse and was responsive, but did feel weak. Vitals were stable post fall and patient was oriented x 4 and alert.  Patient's reaction to the fall: Patient stated he has had a syncope episode many years ago.  Name of the doctor that was notified including date and time: Jefm MD, Debby MD, and Tilford MD were notified. 06/25/2024 at 1020  Any interventions and vital signs: An assessment was done after we got the patient in the stretcher and vitals were taken and stable.

## 2024-06-25 NOTE — Op Note (Signed)
 06/25/2024  9:33 AM  PATIENT:  Jerry Carey  53 y.o. male  Patient Care Team: Clarice Nottingham, MD as PCP - General (Internal Medicine) Lonni Slain, MD as PCP - Cardiology (Cardiology)  PRE-OPERATIVE DIAGNOSIS:  BLEEDING INTERNAL HEMORRHOIDS  POST-OPERATIVE DIAGNOSIS:  BLEEDING INTERNAL HEMORRHOIDS  PROCEDURE:  TRANSANAL HEMORRHOIDAL DEARTERIALIZATION   Surgeon(s): Debby Hila, MD  ASSISTANT: none   ANESTHESIA:   local and MAC  EBL:  15ml  SPECIMEN:  No Specimen  DISPOSITION OF SPECIMEN:  PATHOLOGY  COUNTS:  YES  PLAN OF CARE: Discharge to home after PACU  PATIENT DISPOSITION:  PACU - hemodynamically stable.  INDICATION: 53 y.o. M with bleeding grade 2 hemorrhoids not amendable to rubber band ligation   OR FINDINGS: Grade 2 hemorrhoids right anterior, left lateral and right posterior with significant descent of the dentate line  Description: Informed consent was confirmed. Patient underwent general anesthesia without difficulty. Patient was placed into lithotomy positioning.  The perianal region was prepped and draped in sterile fashion. Surgical time out confirmed or plan.  I did digital rectal examination and then transitioned over to anoscopy to get a sense of the anatomy.  I switched over to the [facility] fiberoptically lit Doppler anocope.   Using the Doppler on the tip of the THD anoscope, I identified the arterial hemorrhoidal vessels coming in in the classic hexagonal anatomical pattern (right posterior/lateral/anterior, left posterior /lateral/anterior).    I proceeded to ligate the hemorrhoidal arteries. I used a 2-0 Vicryl suture on a UR-6 needle in a figure-of-eight fashion over the signal around 6 cm proximal to the anal verge. I then ran that stitch longitudinally more distally to the dentate line. I then tied that stitch down to cause a hemorrhoidopexy. I did that for all 6 locations.    I redid Doppler anoscopy. I Identified a signal at the  right lateral location.  I isolated and ligated this with a figure-of-eight stitch. I identified another signal at the left lateral location. I isolated and ligated this with a figure-of-eight stitch.  Signals went away.  At completion of this, all hemorrhoids were reduced into the rectum. There is no more prolapse. External anatomy looked normal.  I repeated anoscopy and examination.  Hemostasis was good.  Patient is being extubated go to recovery room.  I am about to discuss the patient's status to the family.    Hila JAYSON Debby, MD  Colorectal and General Surgery [facility] Surgery

## 2024-06-25 NOTE — Discharge Instructions (Addendum)
ANORECTAL SURGERY: POST OP INSTRUCTIONS ?Take your usually prescribed home medications unless otherwise directed. ?DIET: During the first few hours after surgery sip on some liquids until you are able to urinate.  It is normal to not urinate for several hours after this surgery.  If you feel uncomfortable, please contact the office for instructions.  After you are able to urinate,you may eat, if you feel like it.  Follow a light bland diet the first 24 hours after arrival home, such as soup, liquids, crackers, etc.  Be sure to include lots of fluids daily (6-8 glasses).  Avoid fast food or heavy meals, as your are more likely to get nauseated.  Eat a low fat diet the next few days after surgery.  Limit caffeine intake to 1-2 servings a day. ?PAIN CONTROL: ?Pain is best controlled by a usual combination of several different methods TOGETHER: ?Muscle relaxation ? Soak in a warm bath (or Sitz bath) three times a day and after bowel movements.  Continue to do this until all pain is resolved. ?Take the muscle relaxer (Valium) every 6 hours for the first 2 days after surgery  ?Over the counter pain medication ?Prescription pain medication ?Most patients will experience some swelling and discomfort in the anus/rectal area and incisions.  Heat such as warm towels, sitz baths, warm baths, etc to help relax tight/sore spots and speed recovery.  Some people prefer to use ice, especially in the first couple days after surgery, as it may decrease the pain and swelling, or alternate between ice & heat.  Experiment to what works for you.  Swelling and bruising can take several weeks to resolve.  Pain can take even longer to completely resolve. ?It is helpful to take an over-the-counter pain medication regularly for the first few weeks.  Choose one of the following that works best for you: ?Naproxen (Aleve, etc)  Two 220mg  tabs twice a day ?Ibuprofen (Advil, etc) Three 200mg  tabs four times a day (every meal & bedtime) ?A   prescription for pain medication (such as percocet, oxycodone, hydrocodone, etc) should be given to you upon discharge.  Take your pain medication as prescribed.  ?If you are having problems/concerns with the prescription medicine (does not control pain, nausea, vomiting, rash, itching, etc), please call us [phone] to see if we need to switch you to a different pain medicine that will work better for you and/or control your side effect better. ?If you need a refill on your pain medication, please contact your pharmacy.  They will contact our office to request authorization. Prescriptions will not be filled after 5 pm or on week-ends. ?KEEP YOUR BOWELS REGULAR and AVOID CONSTIPATION ?The goal is one to two soft bowel movements a day.  You should at least have a bowel movement every other day. ?Avoid getting constipated.  Between the surgery and the pain medications, it is common to experience some constipation. This can be very painful after rectal surgery.  Increasing fluid intake and taking a fiber supplement (such as Metamucil, Citrucel, FiberCon, etc) 1-2 times a day regularly will usually help prevent this problem from occurring.  A stool softener like colace is also recommended.  This can be purchased over the counter at your pharmacy.  You can take it up to 3 times a day.  If you do not have a bowel movement after 24 hrs since your surgery, take one does of milk of magnesia.  If you still haven't had a bowel movement 8-12 hours after  that dose, take another dose.  If you don't have a bowel movement 48 hrs after surgery, purchase a Fleets enema from the drug store and administer gently per package instructions.  If you still are having trouble with your bowel movements after that, please call the office for further instructions. ?If you develop diarrhea or have many loose bowel movements, simplify your diet to bland foods & liquids for a few days.  Stop any stool softeners and decrease your fiber  supplement.  Switching to mild anti-diarrheal medications (Kayopectate, Pepto Bismol) can help.  If this worsens or does not improve, please call us. ? ?Wound Care ?Remove your bandages before your first bowel movement or 8 hours after surgery.     ?Remove any wound packing material at this tim,e as well.  You do not need to repack the wound unless instructed otherwise.  Wear an absorbent pad or soft cotton gauze in your underwear to catch any drainage and help keep the area clean. You should change this every 2-3 hours while awake. ?Keep the area clean and dry.  Bathe / shower every day, especially after bowel movements.  Keep the area clean by showering / bathing over the incision / wound.   It is okay to soak an open wound to help wash it.  Wet wipes or showers / gentle washing after bowel movements is often less traumatic than regular toilet paper. ?You may have some styrofoam-like soft packing in the rectum which will come out with the first bowel movement.  ?You will often notice bleeding with bowel movements.  This should slow down by the end of the first week of surgery ?Expect some drainage.  This should slow down, too, by the end of the first week of surgery.  Wear an absorbent pad or soft cotton gauze in your underwear until the drainage stops. ?Do Not sit on a rubber or pillow ring.  This can make you symptoms worse.  You may sit on a soft pillow if needed.  ?ACTIVITIES as tolerated:   ?You may resume regular (light) daily activities beginning the next day--such as daily self-care, walking, climbing stairs--gradually increasing activities as tolerated.  If you can walk 30 minutes without difficulty, it is safe to try more intense activity such as jogging, treadmill, bicycling, low-impact aerobics, swimming, etc. ?Save the most intensive and strenuous activity for last such as sit-ups, heavy lifting, contact sports, etc  Refrain from any heavy lifting or straining until you are off narcotics for pain  control.   ?You may drive when you are no longer taking prescription pain medication, you can comfortably sit for long periods of time, and you can safely maneuver your car and apply brakes. ?You may have sexual intercourse when it is comfortable.  ?FOLLOW UP in our office ?Please call [facility] at [phone] to set up an appointment to see your surgeon in the office for a follow-up appointment approximately 3-4 weeks after your surgery. ?Make sure that you call for this appointment the day you arrive home to insure a convenient appointment time. ?10. IF YOU HAVE DISABILITY OR FAMILY LEAVE FORMS, BRING THEM TO THE OFFICE FOR PROCESSING.  DO NOT GIVE THEM TO YOUR DOCTOR. ? ? ? ? ?WHEN TO CALL us [phone]: ?Poor pain control ?Reactions / problems with new medications (rash/itching, nausea, etc)  ?Fever over 101.5 F (38.5 C) ?Inability to urinate ?Nausea and/or vomiting ?Worsening swelling or bruising ?Continued bleeding from incision. ?Increased pain, redness, or drainage from the  incision ? ?The clinic staff is available to answer your questions during regular business hours (8:30am-5pm).  Please don?t hesitate to call and ask to speak to one of our nurses for clinical concerns.   A surgeon from [facility] is always on call at the hospitals ?  ?If you have a medical emergency, go to the nearest emergency room or call 911. ?  ? ?[facility], [place] ?[address], [place], [place]  [address] ? ?MAIN: [phone] ? TOLL FREE: [phone] ? ?FAX [phone] ?[email or web address] ? ?  ?

## 2024-06-25 NOTE — Transfer of Care (Signed)
 Immediate Anesthesia Transfer of Care Note  Patient: Jerry Carey  Procedure(s) Performed: TRANSANAL HEMORRHOIDAL DEARTERIALIZATION  Patient Location: PACU  Anesthesia Type:MAC  Level of Consciousness: drowsy and patient cooperative  Airway & Oxygen Therapy: Patient Spontanous Breathing and Patient connected to face mask oxygen  Post-op Assessment: Report given to RN and Post -op Vital signs reviewed and stable  Post vital signs: Reviewed and stable  Last Vitals:  Vitals Value Taken Time  BP 120/84 06/25/24 09:45  Temp 36.6 C 06/25/24 09:45  Pulse 77 06/25/24 09:50  Resp 15 06/25/24 09:50  SpO2 99 % 06/25/24 09:50  Vitals shown include unfiled device data.  Last Pain:  Vitals:   06/25/24 0945  TempSrc:   PainSc: Asleep         Complications: No notable events documented.

## 2024-06-25 NOTE — Anesthesia Procedure Notes (Signed)
 Procedure Name: MAC Date/Time: 06/25/2024 8:36 AM  Performed by: Franchot Delon RAMAN, CRNAPre-anesthesia Checklist: Patient identified, Emergency Drugs available, Suction available and Patient being monitored Oxygen Delivery Method: Simple face mask Placement Confirmation: positive ETCO2 Dental Injury: Teeth and Oropharynx as per pre-operative assessment

## 2024-06-25 NOTE — H&P (Signed)
 "   PROVIDER:  BERNARDA WANDA NED, MD   MRN: [id number] DOB: 06-29-1970 DATE OF ENCOUNTER: 04/28/2024 Interval History:    53 year old male who presents to the office for evaluation of hemorrhoids.  He states that he has had external hemorrhoids for many years due to weight lifting and bodybuilding as a young adult.  They have not bothered him until approximately 11 to 12 months ago when he started developing anal pain and occasional bleeding.  This seems to wax and wane.  He has tried stool softeners and dietary changes with no real success.  His last colonoscopy was in October 2022 and showed a small anal fissure, diverticuli and nonbleeding internal and external hemorrhoids.  Patient was unable to tolerate anoscopy in the office and therefore exam under anesthesia was performed in early December 2023.  The patient was noted to have mildly inflamed external hemorrhoids, no fissures.  Grade 2 hemorrhoids were noted in all 3 positions.  A hemorrhoid rubber band ligation was performed to the left lateral and right anterior hemorrhoids.  He reported significant pain for the first week after the procedure.  He continued to have pain and bleeding.  We attempted rubber band ligation again but he did not tolerate this.  We discussed proceeding with a hemorrhoidal ligation procedure.  He did not want to do this in 2023.  He is here again today to discuss this further.   Past Medical History:  Diagnosis Date   Abscess of left shoulder 06/24/2015   Coagulase-negative staphylococcal infection 07/12/2015   left shoulder wound   Complication of anesthesia    woke up angry and aggitated   GERD (gastroesophageal reflux disease)    History of hiatal hernia    History of stomach ulcers    Infection    left shoulder wound   NSVT (nonsustained ventricular tachycardia) (HCC) 2020   one episode followed Dr. Waddell could not conform   Pectoralis muscle rupture    left   Pneumonia    Sleep apnea    Syncope     Wears glasses     Past Surgical History:  Procedure Laterality Date   APPLICATION OF WOUND VAC  07/15/2015   COLONOSCOPY     I & D EXTREMITY Left 07/21/2015   Procedure: IRRIGATION AND DEBRIDEMENT EXTREMITY; LEFT SHOULDER;  Surgeon: Lamar Millman, MD;  Location: [facility] OR;  Service: Orthopedics;  Laterality: Left;   INCISION AND DRAINAGE Left 06/24/2015   Procedure: INCISION AND DRAINAGE LEFT PECTORALIS MUSCLE;  Surgeon: Fonda Olmsted, MD;  Location: [facility];  Service: Orthopedics;  Laterality: Left;   INCISION AND DRAINAGE ABSCESS Left 07/15/2015   Procedure: OPEN INCISION AND DRAINAGE LEFT SHOULDER ;  Surgeon: Lamar Millman, MD;  Location: [facility] OR;  Service: Orthopedics;  Laterality: Left;   IRRIGATION AND DEBRIDEMENT ABSCESS Left 07/15/2015   LEFT SHOULDER   IRRIGATION AND DEBRIDEMENT SHOULDER Left 07/18/2015   Procedure: IRRIGATION AND DEBRIDEMENT , VERAFLO WOUND VAC PLACEMENT;  Surgeon: Ozell Bruch, MD;  Location: [facility] OR;  Service: Orthopedics;  Laterality: Left;   PECTORALIS TENDON REPAIR Left 06/03/2015   Procedure: LEFT PECTORALIS TENDON REPAIR;  Surgeon: Lamar Millman, MD;  Location: [facility];  Service: Orthopedics;  Laterality: Left;   RECTAL EXAM UNDER ANESTHESIA N/A 06/01/2022   Procedure: ANAL EXAM UNDER ANESTHESIA AND LIGATION OF EXTERNAL HEMMORHOIDS;  Surgeon: Ned Bernarda, MD;  Location: [facility];  Service: General;  Laterality: N/A;   TONSILLECTOMY     WISDOM TOOTH EXTRACTION  Family History  Problem Relation Age of Onset   Hypothyroidism Mother    Meniere's disease Mother    Parkinson's disease Father    Colon cancer Paternal Uncle    Pancreatic cancer Neg Hx    Liver cancer Neg Hx    Rectal cancer Neg Hx     Social History   Socioeconomic History   Marital status: Married    Spouse name: Not on file   Number of children: Not on file   Years of education: Not on file   Highest education level: Not on file  Occupational  History   Not on file  Tobacco Use   Smoking status: Former    Current packs/day: 0.00    Types: Cigarettes    Quit date: 11/28/2008    Years since quitting: 15.5   Smokeless tobacco: Never  Vaping Use   Vaping status: Former   Start date: 11/24/2017   Quit date: 05/03/2020  Substance and Sexual Activity   Alcohol use: Not Currently    Comment: social   Drug use: Yes    Types: Marijuana    Comment: CBD   Sexual activity: Not on file  Other Topics Concern   Not on file  Social History Narrative   Not on file   Social Drivers of Health   Tobacco Use: Medium Risk (06/25/2024)   Patient History    Smoking Tobacco Use: Former    Smokeless Tobacco Use: Never    Passive Exposure: Not on Actuary Strain: Not on file  Food Insecurity: Not on file  Transportation Needs: Not on file  Physical Activity: Not on file  Stress: Not on file  Social Connections: Not on file  Intimate Partner Violence: Not on file  Depression (EYV7-0): Not on file  Alcohol Screen: Not on file  Housing: Unknown (04/28/2024)   Received from [facility]   Epic    Unable to Pay for Housing in the Last Year: Not on file    Number of Times Moved in the Last Year: Not on file    At any time in the past 12 months, were you homeless or living in a shelter (including now)?: No  Utilities: Not on file  Health Literacy: Not on file    Current Medications[1]  Allergies[2]  Review of Systems - Negative except as stated above     Physical Examination:    Gen: NAD   Rectal: Noninflamed external skin tags, no prolapse noted     Procedure: Anoscopy  Surgeon: Debby After the risks and benefits were explained, written consent was obtained for above procedure.  A medical assistant chaperone was present thoroughout the entire procedure.  Anesthesia: none Diagnosis: anal pain and bleeding Findings: grade 2 L lateral hemorrhoid, RA and RP grade 2   Assessment and Plan:     Jerry Carey is a 53 y.o. male who underwent EUA and rubber band ligation on June 01, 2022.    We we discussed proceeding with hemorrhoid ligation as a better option.  We discussed the typical healing time associated with hemorrhoid surgery and the need for time off work.  Patient agreed to proceed with [facility].  Risks include bleeding, pain, recurrence and urinary retention   Diagnoses and all orders for this visit:   Grade II hemorrhoids       The plan was discussed in detail with the patient today, who expressed understanding.  The patient has my contact information, and understands to call  me with any additional questions or concerns in the interval.  I would be happy to see the patient back sooner if the need arises.    Bernarda JAYSON Ned, MD Colon and Rectal Surgery [facility]       [1]  Current Facility-Administered Medications:    acetaminophen  (TYLENOL ) tablet 1,000 mg, 1,000 mg, Oral, Once, Finucane, Elizabeth M, DO   bupivacaine  liposome (EXPAREL ) 1.3 % injection 266 mg, 20 mL, Infiltration, Once, Ned Bernarda, MD   chlorhexidine  (PERIDEX ) 0.12 % solution 15 mL, 15 mL, Mouth/Throat, Once **OR** Oral care mouth rinse, 15 mL, Mouth Rinse, Once, Ellender, Bernardino SQUIBB, MD   lactated ringers  infusion, , Intravenous, Continuous, Ellender, Bernardino SQUIBB, MD, Last Rate: 10 mL/hr at 06/25/24 0727, New Bag at 06/25/24 0727 [2]  Allergies Allergen Reactions   Chlorhexidine  Gluconate Rash    Patient states he had severe burning and rash and was given benadryl    "

## 2024-06-25 NOTE — Anesthesia Preprocedure Evaluation (Addendum)
"                                    Anesthesia Evaluation  Patient identified by MRN, date of birth, ID band Patient awake    Reviewed: Allergy & Precautions, H&P , NPO status , Patient's Chart, lab work & pertinent test results  History of Anesthesia Complications (+) Emergence Delirium and history of anesthetic complications  Airway Mallampati: III  TM Distance: >3 FB Neck ROM: Full    Dental  (+) Teeth Intact, Dental Advisory Given   Pulmonary sleep apnea (noncompliant w/ CPAP) , former smoker   Pulmonary exam normal breath sounds clear to auscultation       Cardiovascular Normal cardiovascular exam+ dysrhythmias (NSVT)  Rhythm:Regular Rate:Normal     Neuro/Psych negative neurological ROS  negative psych ROS   GI/Hepatic Neg liver ROS, hiatal hernia,GERD  Controlled and Medicated,,Bleeding hemorrhoids    Endo/Other  negative endocrine ROS    Renal/GU negative Renal ROS  negative genitourinary   Musculoskeletal negative musculoskeletal ROS (+)    Abdominal   Peds negative pediatric ROS (+)  Hematology negative hematology ROS (+)   Anesthesia Other Findings   Reproductive/Obstetrics negative OB ROS                              Anesthesia Physical Anesthesia Plan  ASA: 3  Anesthesia Plan: MAC   Post-op Pain Management: Tylenol  PO (pre-op)*   Induction:   PONV Risk Score and Plan: 2 and Propofol  infusion and TIVA  Airway Management Planned: Natural Airway and Simple Face Mask  Additional Equipment: None  Intra-op Plan:   Post-operative Plan:   Informed Consent: I have reviewed the patients History and Physical, chart, labs and discussed the procedure including the risks, benefits and alternatives for the proposed anesthesia with the patient or authorized representative who has indicated his/her understanding and acceptance.       Plan Discussed with: CRNA  Anesthesia Plan Comments: (2023 similar  procedure under MAC, no issues- pt adamant that he never had surgery here with Dr. Debby in 2023  Precedex  for hx emergence delirium w/ cscope )         Anesthesia Quick Evaluation  "

## 2024-06-26 NOTE — Anesthesia Postprocedure Evaluation (Signed)
"   Anesthesia Post Note  Patient: Jerry Carey  Procedure(s) Performed: TRANSANAL HEMORRHOIDAL DEARTERIALIZATION     Patient location during evaluation: Phase II Anesthesia Type: MAC Level of consciousness: awake and alert, oriented and patient cooperative Pain management: pain level controlled Vital Signs Assessment: post-procedure vital signs reviewed and stable Respiratory status: spontaneous breathing, nonlabored ventilation and respiratory function stable Cardiovascular status: blood pressure returned to baseline and stable Postop Assessment: no apparent nausea or vomiting Anesthetic complications: no Comments: Syncopal episode in bathroom in PACU, vitals stable afterwards. Pt preference to be discharged to home.   No notable events documented.  Last Vitals:  Vitals:   06/25/24 1215 06/25/24 1245  BP: (!) 148/94 (!) 153/94  Pulse: 83 73  Resp:    Temp:    SpO2: 99% 99%    Last Pain:  Vitals:   06/25/24 1245  TempSrc:   PainSc: 3                  [name]      "

## 2024-06-27 ENCOUNTER — Encounter (HOSPITAL_COMMUNITY): Payer: Self-pay | Admitting: General Surgery

## 2024-07-16 ENCOUNTER — Ambulatory Visit: Admitting: Physician Assistant

## 2024-07-22 NOTE — Progress Notes (Unsigned)
"   °  °  Cardiology Office Note Date:  07/22/2024  ID:  Alicia Ackert, DOB 12/09/1970, MRN [id number] PCP:  Clarice Nottingham, MD  Cardiologist:  Joelle VEAR Ren Donley, MD  No chief complaint on file.    Problems SVT TTE 2/20: > 65%, mild LVH EM 2/20: Long run of NSVT at 200/min CTCA 6/20: CAC 0  Visits  1/26: d/c from clinic    Discussed the use of AI scribe software for clinical note transcription with the patient, who gave verbal consent to proceed.  History of Present Illness     ROS: Please see the history of present illness. All other systems are reviewed and negative.   PHYSICAL EXAM: VS:  There were no vitals taken for this visit. , BMI There is no height or weight on file to calculate BMI. GEN: Well nourished, well developed, in no acute distress HEENT: normal Neck: no JVD, carotid bruits, or masses Cardiac: ***RRR; no murmurs, rubs, or gallops,no edema  Respiratory:  CTAB bilaterally, normal work of breathing GI: soft, nontender, nondistended, + BS Extremities: No LE edema Skin: warm and dry, no rash Neuro:  Strength and sensation are intact  EKG: ***  Recent Labs: Reviewed  Studies: Reviewed  Assessment and Plan Assessment & Plan      Signed, Joelle VEAR Ren Donley, MD  07/22/2024 12:52 PM    [facility] "

## 2024-07-23 NOTE — Progress Notes (Signed)
"   °  °  Cardiology Office Note Date:  07/31/2024  ID:  Jerry Carey, DOB 11/17/1970, MRN [id number] PCP:  Clarice Nottingham, MD  Cardiologist:  Joelle VEAR Ren Donley, MD  No chief complaint on file.    Problems SVT TTE 2/20: > 65%, mild LVH EM 2/20: Long run of NSVT at 200/min CTCA 6/20: CAC 0 Visits  2/26: CAC, BP log, d/c from clinic    Discussed the use of AI scribe software for clinical note transcription with the patient, who gave verbal consent to proceed.  History of Present Illness Jerry Carey is a 54 year old male who presents for cardiovascular follow-up. He is accompanied by his wife, Radiographer, Therapeutic.  He has a history of syncope episodes, with the most recent significant episode occurring in the summer of 2021, which required EMS evaluation at home. During these episodes, he experienced diaphoresis and a lack of detectable pulse, leading to hospitalization for three days. Despite extensive testing, no definitive cause was identified. He was previously evaluated by a cardiologist who noted that his arteries were in good condition.  He is in poor physical shape, overweight, and prediabetic, with fluctuating blood pressure. He is concerned about his blood pressure, which occasionally spikes during stress. Both of his parents were on blood pressure medication later in life.  He developed iron deficiency anemia, which he attributes to significant blood loss from hemorrhoids over the past three years. A colonoscopy revealed hemorrhoids, which were surgically removed five weeks ago, and he anticipates improvement in his anemia.  His family history includes his father's mother who died of heart-related issues, and his mother's sister who had significant heart problems. His father lived to 55 and passed away due to Parkinson's disease, while his mother is currently 30 and alive. He has a history of polyps found in his twenties, leading to regular gastrointestinal  check-ups.    ROS: Please see the history of present illness. All other systems are reviewed and negative.   PHYSICAL EXAM: VS:  BP 104/66   Pulse 64   Ht 6' 1 (1.854 m)   Wt 212 lb 6.4 oz (96.3 kg)   SpO2 96%   BMI 28.02 kg/m  , BMI Body mass index is 28.02 kg/m. GEN: Well nourished, well developed, in no acute distress HEENT: normal Neck: no JVD, carotid bruits, or masses Cardiac: RRR; no murmurs, rubs, or gallops,no edema  Respiratory:  CTAB bilaterally, normal work of breathing GI: soft, nontender, nondistended, + BS Extremities: No LE edema Skin: warm and dry, no rash Neuro:  Strength and sensation are intact  EKG: NSR  Recent Labs: Reviewed  Studies: Reviewed Assessment & Plan  Hypertension Intermittent elevated blood pressure related to situational stress, asymptomatic, no current medication. - Monitor blood pressure daily for two weeks using a home blood pressure cuff. - Maintain a blood pressure log for two weeks. - Consider further evaluation and management if blood pressure remains elevated. - Reassess in clinic if blood pressure is consistently normal.     Signed, Joelle VEAR Ren Donley, MD  07/31/2024 4:18 PM    [facility] "

## 2024-07-28 ENCOUNTER — Ambulatory Visit

## 2024-07-29 ENCOUNTER — Ambulatory Visit

## 2024-07-31 ENCOUNTER — Ambulatory Visit

## 2024-07-31 VITALS — BP 104/66 | HR 64 | Ht 73.0 in | Wt 212.4 lb

## 2024-07-31 DIAGNOSIS — E782 Mixed hyperlipidemia: Secondary | ICD-10-CM | POA: Diagnosis not present

## 2024-07-31 NOTE — Patient Instructions (Signed)
 Medication Instructions:  None  *If you need a refill on your cardiac medications before your next appointment, please call your pharmacy*  Lab Work: None  If you have labs (blood work) drawn today and your tests are completely normal, you will receive your results only by: MyChart Message (if you have MyChart) OR A paper copy in the mail If you have any lab test that is abnormal or we need to change your treatment, we will call you to review the results.  Testing/Procedures: Cardiac Calcium Score (self-pay) Your physician has requested that you have a calcium score CT scan. There is a $99 fee for the scan.    Follow-Up: At [facility], you and your health needs are our priority.  As part of our continuing mission to provide you with exceptional heart care, our providers are all part of one team.  This team includes your primary Cardiologist (physician) and Advanced Practice Providers or APPs (Physician Assistants and Nurse Practitioners) who all work together to provide you with the care you need, when you need it.  Your next appointment:   As needed.    Provider:   Joelle VEAR Ren Donley, MD     Other Instructions Please check blood pressure daily, log and send readings for review.

## 2024-08-20 ENCOUNTER — Ambulatory Visit: Admitting: Physician Assistant

## 2024-08-26 ENCOUNTER — Other Ambulatory Visit (HOSPITAL_COMMUNITY)
